# Patient Record
Sex: Female | Born: 1955 | Race: White | Hispanic: No | Marital: Married | Smoking: Never smoker
Health system: Southern US, Community
[De-identification: ages and names within clinical notes are randomized; demographics above are authoritative.]

---

## 2005-05-15 ENCOUNTER — Emergency Department (HOSPITAL_COMMUNITY): Admission: EM | Admit: 2005-05-15 | Discharge: 2005-05-15 | Payer: Self-pay | Admitting: Emergency Medicine

## 2005-09-13 ENCOUNTER — Encounter (INDEPENDENT_AMBULATORY_CARE_PROVIDER_SITE_OTHER): Payer: Self-pay | Admitting: Diagnostic Radiology

## 2005-09-13 ENCOUNTER — Encounter (INDEPENDENT_AMBULATORY_CARE_PROVIDER_SITE_OTHER): Payer: Self-pay | Admitting: Specialist

## 2005-09-13 ENCOUNTER — Encounter: Admission: RE | Admit: 2005-09-13 | Discharge: 2005-09-13 | Payer: Self-pay | Admitting: Surgery

## 2005-09-21 ENCOUNTER — Encounter: Admission: RE | Admit: 2005-09-21 | Discharge: 2005-09-21 | Payer: Self-pay | Admitting: Surgery

## 2005-09-26 ENCOUNTER — Other Ambulatory Visit: Admission: RE | Admit: 2005-09-26 | Discharge: 2005-09-26 | Payer: Self-pay | Admitting: *Deleted

## 2005-09-29 ENCOUNTER — Ambulatory Visit: Payer: Self-pay | Admitting: Oncology

## 2005-10-07 ENCOUNTER — Ambulatory Visit (HOSPITAL_COMMUNITY): Admission: RE | Admit: 2005-10-07 | Discharge: 2005-10-07 | Payer: Self-pay | Admitting: Oncology

## 2005-10-11 ENCOUNTER — Encounter: Admission: RE | Admit: 2005-10-11 | Discharge: 2005-10-11 | Payer: Self-pay | Admitting: Oncology

## 2005-10-14 ENCOUNTER — Ambulatory Visit: Payer: Self-pay

## 2005-10-14 ENCOUNTER — Ambulatory Visit (HOSPITAL_COMMUNITY): Admission: RE | Admit: 2005-10-14 | Discharge: 2005-10-14 | Payer: Self-pay | Admitting: Gynecology

## 2005-10-14 ENCOUNTER — Encounter: Payer: Self-pay | Admitting: Internal Medicine

## 2005-10-21 ENCOUNTER — Ambulatory Visit (HOSPITAL_BASED_OUTPATIENT_CLINIC_OR_DEPARTMENT_OTHER): Admission: RE | Admit: 2005-10-21 | Discharge: 2005-10-21 | Payer: Self-pay | Admitting: Surgery

## 2005-11-03 LAB — CBC WITH DIFFERENTIAL/PLATELET
BASO%: 0.5 % (ref 0.0–2.0)
Eosinophils Absolute: 0 10*3/uL (ref 0.0–0.5)
MCHC: 34.1 g/dL (ref 32.0–36.0)
MONO#: 0.2 10*3/uL (ref 0.1–0.9)
NEUT#: 1.6 10*3/uL (ref 1.5–6.5)
RBC: 4.08 10*6/uL (ref 3.70–5.32)
RDW: 13.9 % (ref 11.3–14.5)
WBC: 3.1 10*3/uL — ABNORMAL LOW (ref 3.9–10.0)

## 2005-11-09 LAB — CBC WITH DIFFERENTIAL/PLATELET
BASO%: 0.3 % (ref 0.0–2.0)
Eosinophils Absolute: 0 10*3/uL (ref 0.0–0.5)
HCT: 39.4 % (ref 34.8–46.6)
LYMPH%: 20.8 % (ref 14.0–48.0)
MCHC: 33.5 g/dL (ref 32.0–36.0)
MONO#: 0.8 10*3/uL (ref 0.1–0.9)
NEUT#: 5.9 10*3/uL (ref 1.5–6.5)
NEUT%: 69.1 % (ref 39.6–76.8)
Platelets: 210 10*3/uL (ref 145–400)
RBC: 4.41 10*6/uL (ref 3.70–5.32)
WBC: 8.6 10*3/uL (ref 3.9–10.0)
lymph#: 1.8 10*3/uL (ref 0.9–3.3)

## 2005-11-15 ENCOUNTER — Ambulatory Visit: Payer: Self-pay | Admitting: Oncology

## 2005-11-16 LAB — CBC WITH DIFFERENTIAL/PLATELET
Basophils Absolute: 0 10*3/uL (ref 0.0–0.1)
HCT: 36.5 % (ref 34.8–46.6)
HGB: 12.4 g/dL (ref 11.6–15.9)
LYMPH%: 20.7 % (ref 14.0–48.0)
MCHC: 33.9 g/dL (ref 32.0–36.0)
MONO#: 0.1 10*3/uL (ref 0.1–0.9)
NEUT%: 74.8 % (ref 39.6–76.8)
Platelets: 210 10*3/uL (ref 145–400)
WBC: 3.1 10*3/uL — ABNORMAL LOW (ref 3.9–10.0)
lymph#: 0.6 10*3/uL — ABNORMAL LOW (ref 0.9–3.3)

## 2005-11-23 LAB — CBC WITH DIFFERENTIAL/PLATELET
BASO%: 0.3 % (ref 0.0–2.0)
Basophils Absolute: 0 10*3/uL (ref 0.0–0.1)
EOS%: 0.1 % (ref 0.0–7.0)
HCT: 35.1 % (ref 34.8–46.6)
HGB: 11.9 g/dL (ref 11.6–15.9)
LYMPH%: 15.9 % (ref 14.0–48.0)
MCH: 30.3 pg (ref 26.0–34.0)
MCHC: 34 g/dL (ref 32.0–36.0)
MCV: 89.3 fL (ref 81.0–101.0)
MONO%: 8.6 % (ref 0.0–13.0)
NEUT%: 75.1 % (ref 39.6–76.8)
Platelets: 147 10*3/uL (ref 145–400)

## 2005-11-30 LAB — CBC WITH DIFFERENTIAL/PLATELET
Basophils Absolute: 0 10*3/uL (ref 0.0–0.1)
Eosinophils Absolute: 0 10*3/uL (ref 0.0–0.5)
HCT: 34.1 % — ABNORMAL LOW (ref 34.8–46.6)
HGB: 11.7 g/dL (ref 11.6–15.9)
MONO#: 0.1 10*3/uL (ref 0.1–0.9)
NEUT#: 2.4 10*3/uL (ref 1.5–6.5)
NEUT%: 80.7 % — ABNORMAL HIGH (ref 39.6–76.8)
WBC: 3 10*3/uL — ABNORMAL LOW (ref 3.9–10.0)
lymph#: 0.5 10*3/uL — ABNORMAL LOW (ref 0.9–3.3)

## 2005-12-08 LAB — CBC WITH DIFFERENTIAL/PLATELET
Basophils Absolute: 0.1 10*3/uL (ref 0.0–0.1)
EOS%: 0.2 % (ref 0.0–7.0)
Eosinophils Absolute: 0 10*3/uL (ref 0.0–0.5)
HCT: 36.2 % (ref 34.8–46.6)
HGB: 12.7 g/dL (ref 11.6–15.9)
MCH: 31 pg (ref 26.0–34.0)
MCV: 88.7 fL (ref 81.0–101.0)
MONO%: 15.7 % — ABNORMAL HIGH (ref 0.0–13.0)
NEUT%: 61.8 % (ref 39.6–76.8)

## 2005-12-15 ENCOUNTER — Encounter: Admission: RE | Admit: 2005-12-15 | Discharge: 2005-12-15 | Payer: Self-pay | Admitting: Oncology

## 2005-12-19 LAB — CBC WITH DIFFERENTIAL/PLATELET
Basophils Absolute: 0.2 10*3/uL — ABNORMAL HIGH (ref 0.0–0.1)
EOS%: 0.1 % (ref 0.0–7.0)
HGB: 11.7 g/dL (ref 11.6–15.9)
LYMPH%: 11.7 % — ABNORMAL LOW (ref 14.0–48.0)
MCH: 30.8 pg (ref 26.0–34.0)
MCV: 89.5 fL (ref 81.0–101.0)
MONO%: 8.2 % (ref 0.0–13.0)
Platelets: 151 10*3/uL (ref 145–400)
RBC: 3.82 10*6/uL (ref 3.70–5.32)
RDW: 16.6 % — ABNORMAL HIGH (ref 11.3–14.5)

## 2005-12-22 LAB — CBC WITH DIFFERENTIAL/PLATELET
BASO%: 1.2 % (ref 0.0–2.0)
EOS%: 0.1 % (ref 0.0–7.0)
Eosinophils Absolute: 0 10*3/uL (ref 0.0–0.5)
LYMPH%: 12.1 % — ABNORMAL LOW (ref 14.0–48.0)
MCHC: 35.2 g/dL (ref 32.0–36.0)
MCV: 88.8 fL (ref 81.0–101.0)
MONO%: 9.1 % (ref 0.0–13.0)
NEUT#: 6.9 10*3/uL — ABNORMAL HIGH (ref 1.5–6.5)
Platelets: 193 10*3/uL (ref 145–400)
RBC: 3.99 10*6/uL (ref 3.70–5.32)
RDW: 16.5 % — ABNORMAL HIGH (ref 11.3–14.5)

## 2005-12-28 ENCOUNTER — Ambulatory Visit: Payer: Self-pay | Admitting: Oncology

## 2005-12-28 LAB — CBC WITH DIFFERENTIAL/PLATELET
BASO%: 1.4 % (ref 0.0–2.0)
Eosinophils Absolute: 0 10*3/uL (ref 0.0–0.5)
LYMPH%: 36.9 % (ref 14.0–48.0)
MCHC: 34.3 g/dL (ref 32.0–36.0)
MONO#: 0.4 10*3/uL (ref 0.1–0.9)
NEUT#: 0.7 10*3/uL — ABNORMAL LOW (ref 1.5–6.5)
RBC: 3.75 10*6/uL (ref 3.70–5.32)
RDW: 18.5 % — ABNORMAL HIGH (ref 11.3–14.5)
WBC: 1.9 10*3/uL — ABNORMAL LOW (ref 3.9–10.0)
lymph#: 0.7 10*3/uL — ABNORMAL LOW (ref 0.9–3.3)

## 2006-01-06 LAB — CBC WITH DIFFERENTIAL/PLATELET
BASO%: 2.6 % — ABNORMAL HIGH (ref 0.0–2.0)
LYMPH%: 25.2 % (ref 14.0–48.0)
MCHC: 33.9 g/dL (ref 32.0–36.0)
MONO#: 0.9 10*3/uL (ref 0.1–0.9)
MONO%: 30 % — ABNORMAL HIGH (ref 0.0–13.0)
NEUT#: 1.3 10*3/uL — ABNORMAL LOW (ref 1.5–6.5)
Platelets: 209 10*3/uL (ref 145–400)
RBC: 4.04 10*6/uL (ref 3.70–5.32)
RDW: 15.3 % — ABNORMAL HIGH (ref 11.3–14.5)
WBC: 3 10*3/uL — ABNORMAL LOW (ref 3.9–10.0)

## 2006-01-13 LAB — CBC WITH DIFFERENTIAL/PLATELET
BASO%: 1.8 % (ref 0.0–2.0)
Eosinophils Absolute: 0.1 10*3/uL (ref 0.0–0.5)
HCT: 39.5 % (ref 34.8–46.6)
MCHC: 33.6 g/dL (ref 32.0–36.0)
MONO#: 0.7 10*3/uL (ref 0.1–0.9)
NEUT#: 2.7 10*3/uL (ref 1.5–6.5)
RBC: 4.31 10*6/uL (ref 3.70–5.32)
WBC: 4.5 10*3/uL (ref 3.9–10.0)
lymph#: 1 10*3/uL (ref 0.9–3.3)

## 2006-01-20 LAB — CBC WITH DIFFERENTIAL/PLATELET
Basophils Absolute: 0.1 10*3/uL (ref 0.0–0.1)
Eosinophils Absolute: 0.1 10*3/uL (ref 0.0–0.5)
HCT: 35.8 % (ref 34.8–46.6)
HGB: 12.1 g/dL (ref 11.6–15.9)
MONO#: 0.5 10*3/uL (ref 0.1–0.9)
NEUT%: 47 % (ref 39.6–76.8)
WBC: 2.7 10*3/uL — ABNORMAL LOW (ref 3.9–10.0)
lymph#: 0.8 10*3/uL — ABNORMAL LOW (ref 0.9–3.3)

## 2006-01-27 LAB — CBC WITH DIFFERENTIAL/PLATELET
BASO%: 3 % — ABNORMAL HIGH (ref 0.0–2.0)
HCT: 35 % (ref 34.8–46.6)
MCHC: 34.3 g/dL (ref 32.0–36.0)
MONO#: 0.4 10*3/uL (ref 0.1–0.9)
NEUT%: 43 % (ref 39.6–76.8)
RBC: 3.82 10*6/uL (ref 3.70–5.32)
WBC: 2.4 10*3/uL — ABNORMAL LOW (ref 3.9–10.0)
lymph#: 0.8 10*3/uL — ABNORMAL LOW (ref 0.9–3.3)

## 2006-02-03 LAB — CBC WITH DIFFERENTIAL/PLATELET
Eosinophils Absolute: 0 10*3/uL (ref 0.0–0.5)
HGB: 12.7 g/dL (ref 11.6–15.9)
LYMPH%: 32.3 % (ref 14.0–48.0)
MONO#: 0.4 10*3/uL (ref 0.1–0.9)
NEUT#: 1.3 10*3/uL — ABNORMAL LOW (ref 1.5–6.5)
Platelets: 249 10*3/uL (ref 145–400)
RBC: 4.03 10*6/uL (ref 3.70–5.32)
WBC: 2.7 10*3/uL — ABNORMAL LOW (ref 3.9–10.0)

## 2006-02-10 ENCOUNTER — Ambulatory Visit: Payer: Self-pay | Admitting: Oncology

## 2006-02-10 LAB — CBC WITH DIFFERENTIAL/PLATELET
Eosinophils Absolute: 0 10*3/uL (ref 0.0–0.5)
HCT: 39.8 % (ref 34.8–46.6)
LYMPH%: 38.9 % (ref 14.0–48.0)
MCHC: 34 g/dL (ref 32.0–36.0)
MONO#: 0.9 10*3/uL (ref 0.1–0.9)
NEUT#: 1 10*3/uL — ABNORMAL LOW (ref 1.5–6.5)
NEUT%: 31.8 % — ABNORMAL LOW (ref 39.6–76.8)
Platelets: 228 10*3/uL (ref 145–400)
WBC: 3.2 10*3/uL — ABNORMAL LOW (ref 3.9–10.0)

## 2006-02-17 LAB — CBC WITH DIFFERENTIAL/PLATELET
Basophils Absolute: 0.1 10*3/uL (ref 0.0–0.1)
EOS%: 1.2 % (ref 0.0–7.0)
Eosinophils Absolute: 0 10*3/uL (ref 0.0–0.5)
HGB: 12.7 g/dL (ref 11.6–15.9)
LYMPH%: 42.4 % (ref 14.0–48.0)
MCH: 32 pg (ref 26.0–34.0)
MCV: 93.6 fL (ref 81.0–101.0)
MONO%: 14.2 % — ABNORMAL HIGH (ref 0.0–13.0)
NEUT#: 0.9 10*3/uL — ABNORMAL LOW (ref 1.5–6.5)
NEUT%: 39.6 % (ref 39.6–76.8)
Platelets: 179 10*3/uL (ref 145–400)
RDW: 12.6 % (ref 11.3–14.5)

## 2006-02-24 LAB — CBC WITH DIFFERENTIAL/PLATELET
EOS%: 1.6 % (ref 0.0–7.0)
Eosinophils Absolute: 0 10*3/uL (ref 0.0–0.5)
LYMPH%: 38.2 % (ref 14.0–48.0)
MCH: 31.8 pg (ref 26.0–34.0)
MCHC: 34 g/dL (ref 32.0–36.0)
MCV: 93.4 fL (ref 81.0–101.0)
MONO%: 15.1 % — ABNORMAL HIGH (ref 0.0–13.0)
NEUT#: 1 10*3/uL — ABNORMAL LOW (ref 1.5–6.5)
Platelets: 205 10*3/uL (ref 145–400)
RBC: 3.81 10*6/uL (ref 3.70–5.32)
RDW: 12.9 % (ref 11.3–14.5)

## 2006-03-03 LAB — CBC WITH DIFFERENTIAL/PLATELET
Basophils Absolute: 0.1 10*3/uL (ref 0.0–0.1)
Eosinophils Absolute: 0 10*3/uL (ref 0.0–0.5)
HGB: 12.7 g/dL (ref 11.6–15.9)
MCV: 93 fL (ref 81.0–101.0)
MONO%: 10.3 % (ref 0.0–13.0)
NEUT#: 1.1 10*3/uL — ABNORMAL LOW (ref 1.5–6.5)
Platelets: 216 10*3/uL (ref 145–400)
RDW: 12.7 % (ref 11.3–14.5)

## 2006-03-20 ENCOUNTER — Encounter: Admission: RE | Admit: 2006-03-20 | Discharge: 2006-03-20 | Payer: Self-pay | Admitting: Oncology

## 2006-03-24 LAB — CBC WITH DIFFERENTIAL/PLATELET
Basophils Absolute: 0.1 10*3/uL (ref 0.0–0.1)
Eosinophils Absolute: 0.1 10*3/uL (ref 0.0–0.5)
HCT: 41.8 % (ref 34.8–46.6)
LYMPH%: 30.8 % (ref 14.0–48.0)
MCV: 91.3 fL (ref 81.0–101.0)
MONO%: 18.5 % — ABNORMAL HIGH (ref 0.0–13.0)
NEUT#: 1.6 10*3/uL (ref 1.5–6.5)
NEUT%: 46.8 % (ref 39.6–76.8)
Platelets: 183 10*3/uL (ref 145–400)
RBC: 4.57 10*6/uL (ref 3.70–5.32)

## 2006-04-10 ENCOUNTER — Encounter: Admission: RE | Admit: 2006-04-10 | Discharge: 2006-04-10 | Payer: Self-pay | Admitting: Surgery

## 2006-04-11 ENCOUNTER — Encounter: Admission: RE | Admit: 2006-04-11 | Discharge: 2006-04-11 | Payer: Self-pay | Admitting: Surgery

## 2006-04-11 ENCOUNTER — Encounter (INDEPENDENT_AMBULATORY_CARE_PROVIDER_SITE_OTHER): Payer: Self-pay | Admitting: Surgery

## 2006-04-11 ENCOUNTER — Encounter (INDEPENDENT_AMBULATORY_CARE_PROVIDER_SITE_OTHER): Payer: Self-pay | Admitting: Specialist

## 2006-04-11 ENCOUNTER — Ambulatory Visit (HOSPITAL_BASED_OUTPATIENT_CLINIC_OR_DEPARTMENT_OTHER): Admission: RE | Admit: 2006-04-11 | Discharge: 2006-04-11 | Payer: Self-pay | Admitting: Surgery

## 2006-04-21 ENCOUNTER — Ambulatory Visit: Payer: Self-pay | Admitting: Oncology

## 2006-04-25 LAB — CBC WITH DIFFERENTIAL/PLATELET
BASO%: 0.4 % (ref 0.0–2.0)
EOS%: 1 % (ref 0.0–7.0)
LYMPH%: 37.5 % (ref 14.0–48.0)
MCH: 30.8 pg (ref 26.0–34.0)
MCHC: 34.3 g/dL (ref 32.0–36.0)
MCV: 90 fL (ref 81.0–101.0)
MONO%: 11.6 % (ref 0.0–13.0)
NEUT%: 49.5 % (ref 39.6–76.8)
Platelets: 215 10*3/uL (ref 145–400)
RBC: 4.38 10*6/uL (ref 3.70–5.32)
WBC: 3.5 10*3/uL — ABNORMAL LOW (ref 3.9–10.0)

## 2006-05-01 ENCOUNTER — Ambulatory Visit: Admission: RE | Admit: 2006-05-01 | Discharge: 2006-07-09 | Payer: Self-pay | Admitting: *Deleted

## 2006-05-03 LAB — COMPREHENSIVE METABOLIC PANEL
ALT: 36 U/L (ref 0–40)
AST: 44 U/L — ABNORMAL HIGH (ref 0–37)
Alkaline Phosphatase: 48 U/L (ref 39–117)
CO2: 27 mEq/L (ref 19–32)
Creatinine, Ser: 0.77 mg/dL (ref 0.40–1.20)
Total Bilirubin: 0.5 mg/dL (ref 0.3–1.2)

## 2006-05-03 LAB — CANCER ANTIGEN 27.29: CA 27.29: 16 U/mL (ref 0–39)

## 2006-06-14 ENCOUNTER — Ambulatory Visit (HOSPITAL_BASED_OUTPATIENT_CLINIC_OR_DEPARTMENT_OTHER): Admission: RE | Admit: 2006-06-14 | Discharge: 2006-06-14 | Payer: Self-pay | Admitting: Surgery

## 2006-07-20 ENCOUNTER — Ambulatory Visit: Payer: Self-pay | Admitting: Oncology

## 2006-07-27 LAB — CBC WITH DIFFERENTIAL/PLATELET
Basophils Absolute: 0 10*3/uL (ref 0.0–0.1)
EOS%: 0.6 % (ref 0.0–7.0)
HCT: 42.8 % (ref 34.8–46.6)
HGB: 14.7 g/dL (ref 11.6–15.9)
MCH: 30.8 pg (ref 26.0–34.0)
MCV: 89.9 fL (ref 81.0–101.0)
MONO%: 10.2 % (ref 0.0–13.0)
NEUT%: 65.6 % (ref 39.6–76.8)

## 2006-07-27 LAB — COMPREHENSIVE METABOLIC PANEL
AST: 55 U/L — ABNORMAL HIGH (ref 0–37)
Albumin: 4.7 g/dL (ref 3.5–5.2)
Alkaline Phosphatase: 70 U/L (ref 39–117)
BUN: 8 mg/dL (ref 6–23)
CO2: 27 mEq/L (ref 19–32)
Creatinine, Ser: 0.77 mg/dL (ref 0.40–1.20)

## 2006-10-10 ENCOUNTER — Ambulatory Visit: Payer: Self-pay | Admitting: Oncology

## 2006-10-20 LAB — CBC WITH DIFFERENTIAL/PLATELET
BASO%: 0.5 % (ref 0.0–2.0)
Basophils Absolute: 0 10*3/uL (ref 0.0–0.1)
EOS%: 0.5 % (ref 0.0–7.0)
HGB: 15.1 g/dL (ref 11.6–15.9)
MCH: 31.8 pg (ref 26.0–34.0)
MCHC: 35.6 g/dL (ref 32.0–36.0)
MCV: 89.3 fL (ref 81.0–101.0)
MONO%: 9.9 % (ref 0.0–13.0)
RBC: 4.76 10*6/uL (ref 3.70–5.32)
RDW: 12.3 % (ref 11.3–14.5)

## 2006-10-23 ENCOUNTER — Ambulatory Visit (HOSPITAL_COMMUNITY): Admission: RE | Admit: 2006-10-23 | Discharge: 2006-10-23 | Payer: Self-pay | Admitting: Oncology

## 2006-10-23 LAB — COMPREHENSIVE METABOLIC PANEL
AST: 48 U/L — ABNORMAL HIGH (ref 0–37)
BUN: 9 mg/dL (ref 6–23)
Calcium: 10 mg/dL (ref 8.4–10.5)
Chloride: 104 mEq/L (ref 96–112)
Creatinine, Ser: 0.76 mg/dL (ref 0.40–1.20)

## 2006-10-23 LAB — HEPATITIS B SURFACE ANTIGEN: Hepatitis B Surface Ag: NEGATIVE

## 2006-10-23 LAB — HEPATITIS C ANTIBODY: HCV Ab: POSITIVE — AB

## 2006-10-31 LAB — HEPATITIS C RNA QUANTITATIVE: HCV Quantitative: 5640000 IU/mL — ABNORMAL HIGH (ref <5)

## 2007-01-16 ENCOUNTER — Ambulatory Visit: Payer: Self-pay | Admitting: Oncology

## 2007-02-13 LAB — COMPREHENSIVE METABOLIC PANEL
Alkaline Phosphatase: 72 U/L (ref 39–117)
Glucose, Bld: 109 mg/dL — ABNORMAL HIGH (ref 70–99)
Sodium: 140 mEq/L (ref 135–145)
Total Bilirubin: 0.6 mg/dL (ref 0.3–1.2)
Total Protein: 7.3 g/dL (ref 6.0–8.3)

## 2007-02-13 LAB — CBC WITH DIFFERENTIAL/PLATELET
BASO%: 0.6 % (ref 0.0–2.0)
EOS%: 0.8 % (ref 0.0–7.0)
Eosinophils Absolute: 0 10*3/uL (ref 0.0–0.5)
LYMPH%: 29.3 % (ref 14.0–48.0)
MCH: 31 pg (ref 26.0–34.0)
MCHC: 35.4 g/dL (ref 32.0–36.0)
MCV: 87.6 fL (ref 81.0–101.0)
MONO%: 10.3 % (ref 0.0–13.0)
Platelets: 180 10*3/uL (ref 145–400)
RBC: 4.32 10*6/uL (ref 3.70–5.32)

## 2007-04-04 ENCOUNTER — Encounter: Admission: RE | Admit: 2007-04-04 | Discharge: 2007-04-04 | Payer: Self-pay | Admitting: Oncology

## 2007-04-17 ENCOUNTER — Ambulatory Visit: Payer: Self-pay | Admitting: Oncology

## 2007-04-19 LAB — COMPREHENSIVE METABOLIC PANEL
ALT: 51 U/L — ABNORMAL HIGH (ref 0–35)
Albumin: 4.5 g/dL (ref 3.5–5.2)
CO2: 26 mEq/L (ref 19–32)
Calcium: 9.2 mg/dL (ref 8.4–10.5)
Chloride: 102 mEq/L (ref 96–112)
Sodium: 139 mEq/L (ref 135–145)
Total Protein: 7.5 g/dL (ref 6.0–8.3)

## 2007-04-19 LAB — CBC WITH DIFFERENTIAL/PLATELET
BASO%: 0.3 % (ref 0.0–2.0)
Eosinophils Absolute: 0 10*3/uL (ref 0.0–0.5)
HCT: 39 % (ref 34.8–46.6)
LYMPH%: 34.3 % (ref 14.0–48.0)
MCHC: 34.8 g/dL (ref 32.0–36.0)
MONO#: 0.5 10*3/uL (ref 0.1–0.9)
NEUT%: 54.5 % (ref 39.6–76.8)
Platelets: 198 10*3/uL (ref 145–400)
WBC: 4.8 10*3/uL (ref 3.9–10.0)

## 2007-04-19 LAB — CANCER ANTIGEN 27.29: CA 27.29: 18 U/mL (ref 0–39)

## 2007-04-19 LAB — TSH: TSH: 1.229 u[IU]/mL (ref 0.350–5.500)

## 2007-05-15 ENCOUNTER — Ambulatory Visit: Payer: Self-pay | Admitting: Gastroenterology

## 2007-05-28 IMAGING — CR DG ANKLE COMPLETE 3+V*R*
3 series · 3 of 3 positions shown · non-contrast
Comparison: none

CLINICAL DATA: Trauma to the right ankle with pain and swelling over the lateral malleolus.
 RIGHT ANKLE - 3 VIEW:

[t ankle joint ap right]
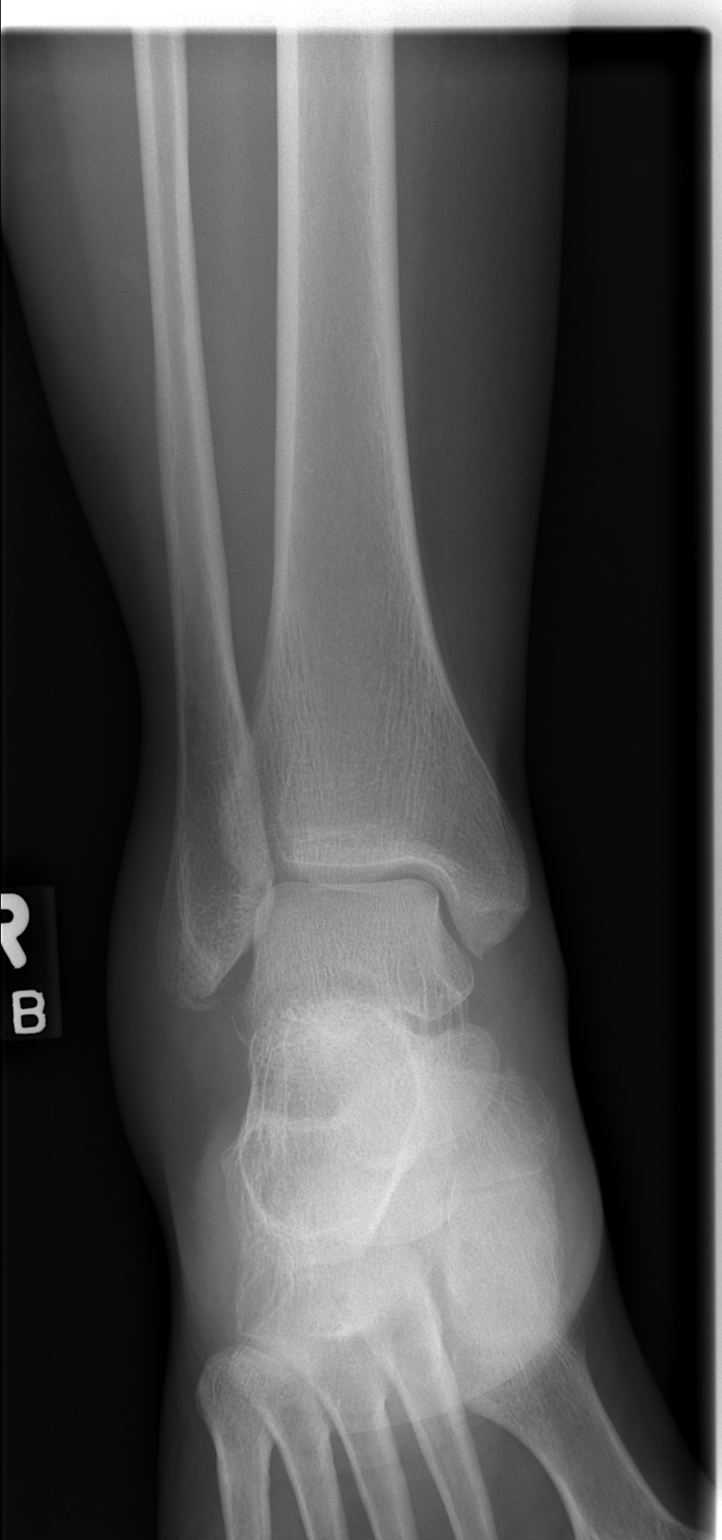

[t ankle joint oblique right]
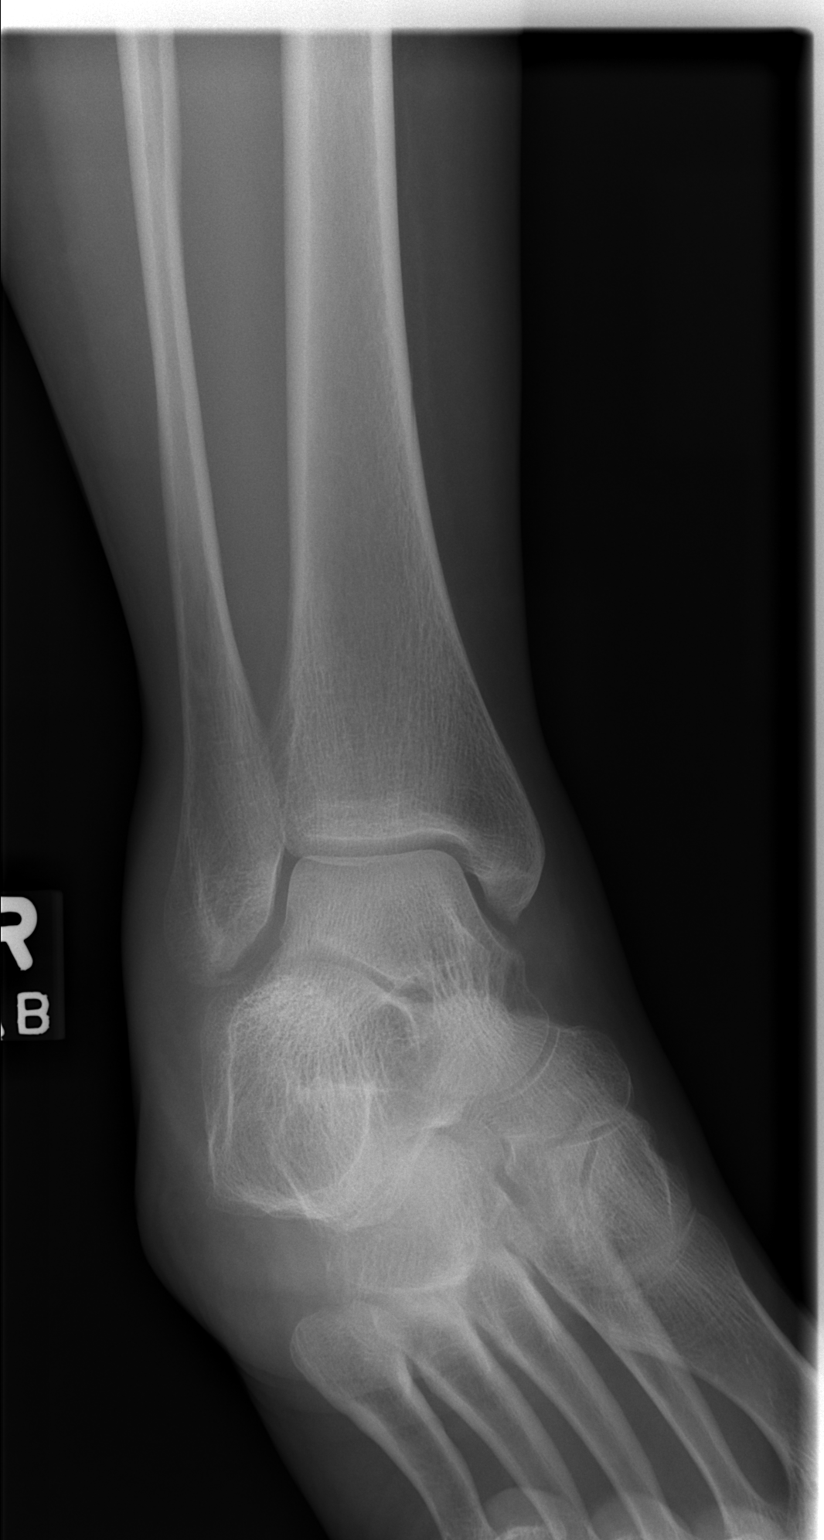

[t ankle joint lat right]
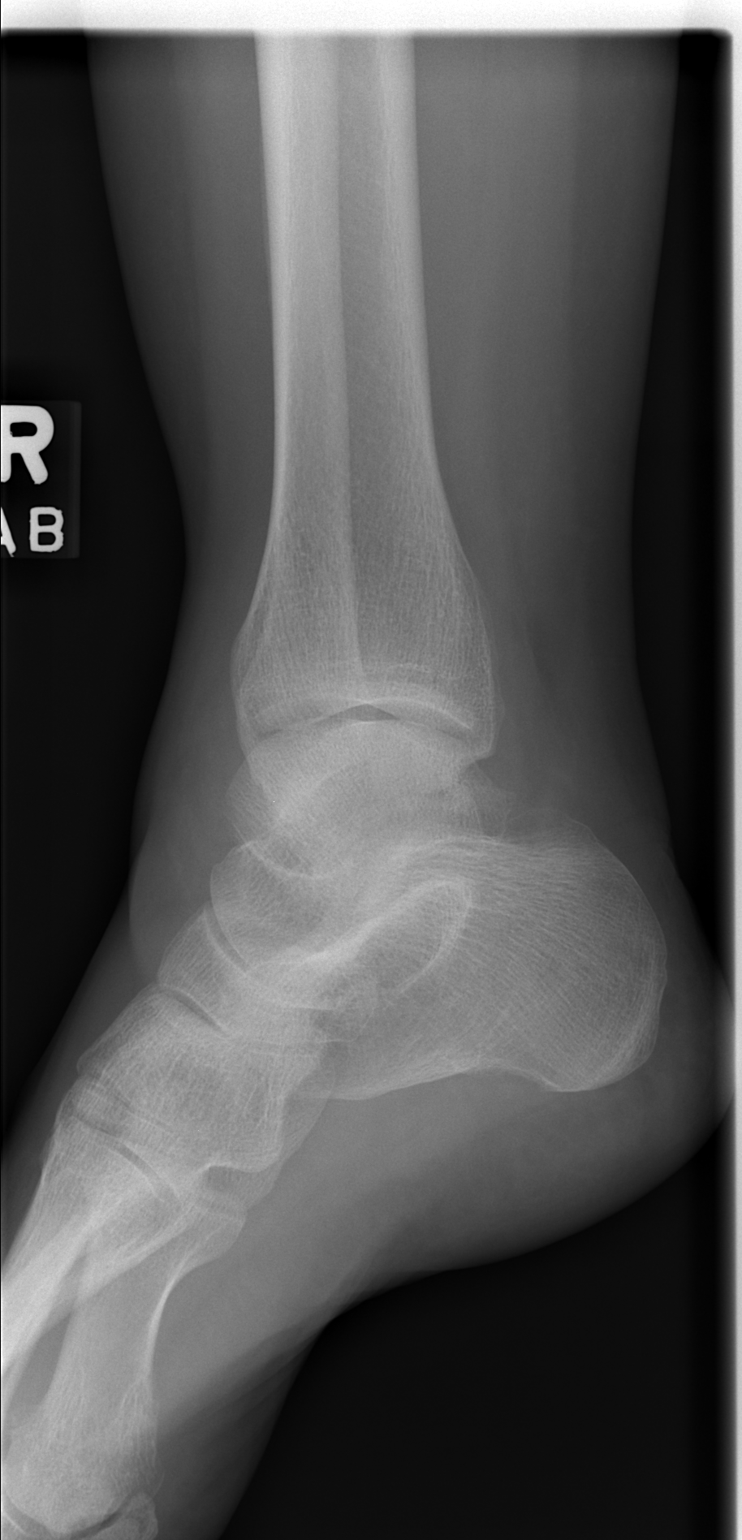

[3 of 3 positions shown; findings below may reference images not displayed]

FINDINGS: There is an avulsion fracture involving the distal right fibula with overlying lateral malleolar swelling.   Ankle mortise is symmetric.   No other fracture or evidence for dislocation is seen.
IMPRESSION: Avulsion fracture of the distal right fibula with overlying soft tissue swelling.

## 2007-06-12 ENCOUNTER — Encounter (INDEPENDENT_AMBULATORY_CARE_PROVIDER_SITE_OTHER): Payer: Self-pay | Admitting: Interventional Radiology

## 2007-06-12 ENCOUNTER — Ambulatory Visit (HOSPITAL_COMMUNITY): Admission: RE | Admit: 2007-06-12 | Discharge: 2007-06-12 | Payer: Self-pay | Admitting: Gastroenterology

## 2007-07-10 ENCOUNTER — Ambulatory Visit: Payer: Self-pay | Admitting: Oncology

## 2007-07-12 LAB — COMPREHENSIVE METABOLIC PANEL
ALT: 46 U/L — ABNORMAL HIGH (ref 0–35)
AST: 44 U/L — ABNORMAL HIGH (ref 0–37)
Calcium: 9.4 mg/dL (ref 8.4–10.5)
Chloride: 103 mEq/L (ref 96–112)
Creatinine, Ser: 0.95 mg/dL (ref 0.40–1.20)

## 2007-07-12 LAB — CBC WITH DIFFERENTIAL/PLATELET
BASO%: 0.6 % (ref 0.0–2.0)
EOS%: 1.8 % (ref 0.0–7.0)
MCH: 30.4 pg (ref 26.0–34.0)
MCHC: 34.7 g/dL (ref 32.0–36.0)
NEUT%: 38 % — ABNORMAL LOW (ref 39.6–76.8)
RDW: 12.9 % (ref 11.3–14.5)
lymph#: 1.6 10*3/uL (ref 0.9–3.3)

## 2007-10-15 ENCOUNTER — Ambulatory Visit: Payer: Self-pay | Admitting: Oncology

## 2007-10-20 IMAGING — CT CT ABDOMEN W/ CM
2 of 5 series · 15 of 46 positions shown, 17 images · IV contrast (omnipaque)
Comparison: None.

CLINICAL DATA: Newly diagnosed breast cancer.  Now for staging beyond the axilla.
 FDG PET-CT TUMOR IMAGING (SKULL BASE TO THIGHS):
 Fasting Blood Glucose:  103.
TECHNIQUE: 14.75 mCi F-18 FDG were administered via right antecubital fossa.  Full ring PET imaging was performed from the skull base through the mid-thighs 45 minutes after injection.  CT data was obtained and used for attenuation correction and anatomic localization only.  (This was not acquired as a diagnostic CT examination).
 There is a mass within the right breast, which shows significant FDG accumulation.  The mass measures 3.0 x 2.8 cm.  Maximum SUV is 10.2.  No other areas of abnormal FDG accumulation within the neck, chest, abdomen, or pelvis.  No abnormal activity within the right axilla.
TECHNIQUE: Multidetector CT imaging of the chest was performed following the standard protocol during bolus administration of intravenous contrast.
 Contrast:  125 cc Omnipaque 300.
TECHNIQUE: Multidetector CT imaging of the abdomen was performed following the standard protocol during bolus administration of intravenous contrast.
TECHNIQUE: Multidetector CT imaging of the pelvis was performed following the standard protocol during bolus administration of intravenous contrast.

[Series 2: cap 5.0 b40f st · axial · 0.65mm/px · z∈[-626,-80]mm · 12 of 125 slices shown, 14 images]
[im 8/125  soft-tissue]
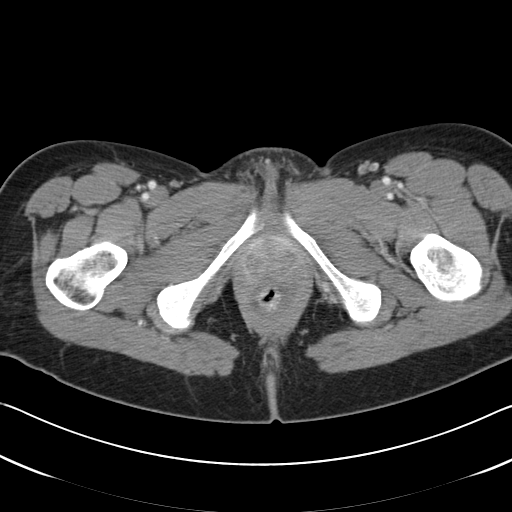
[im 8/125  bone]
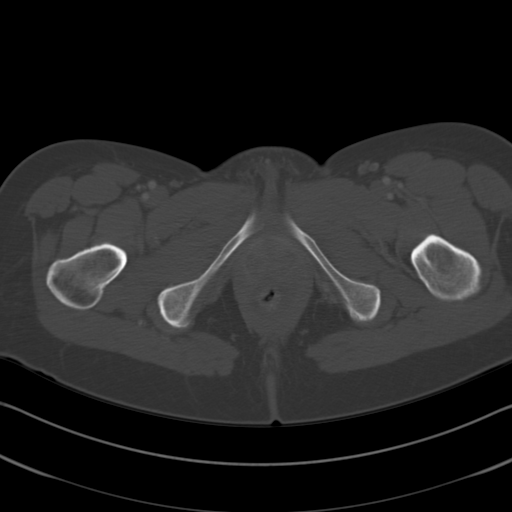
[im 22/125  soft-tissue]
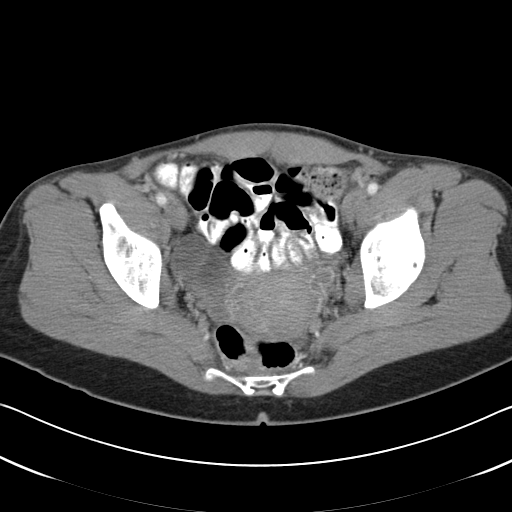
[im 30/125  soft-tissue]
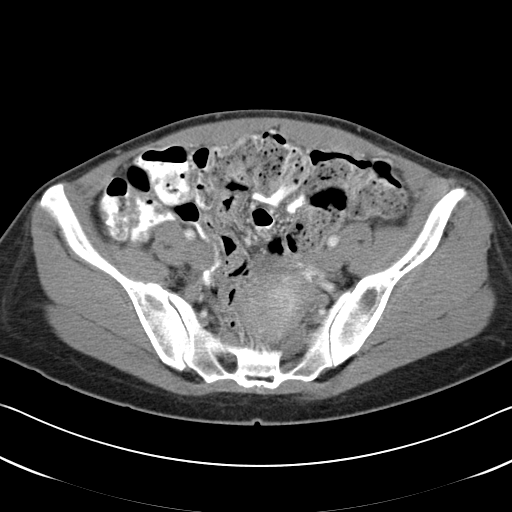
[im 37/125  soft-tissue]
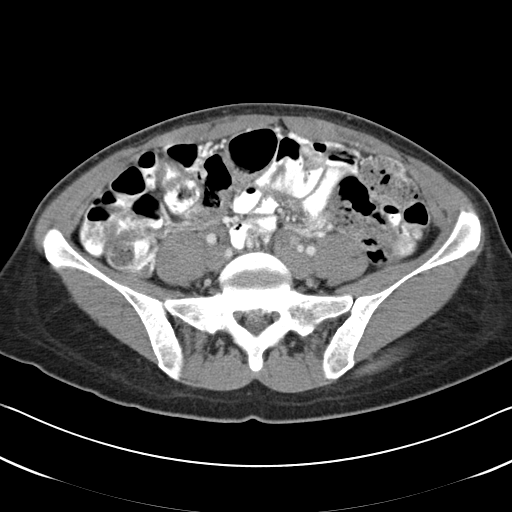
[im 52/125  soft-tissue]
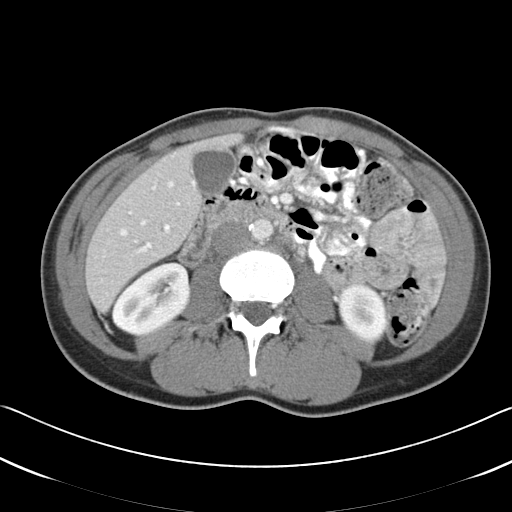
[im 59/125  soft-tissue]
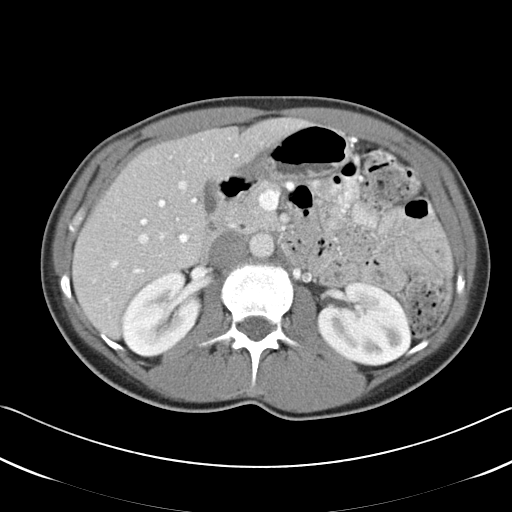
[im 66/125  soft-tissue]
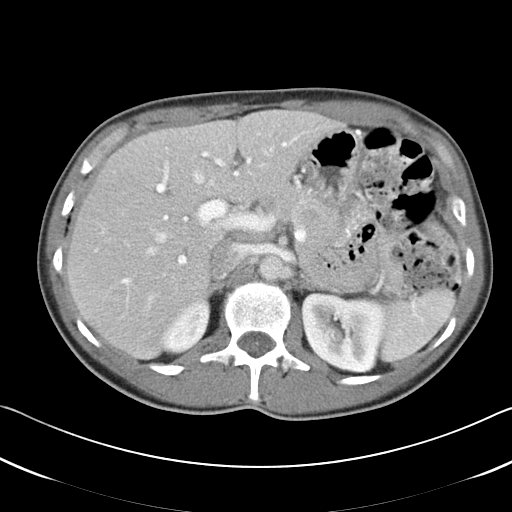
[im 81/125  soft-tissue]
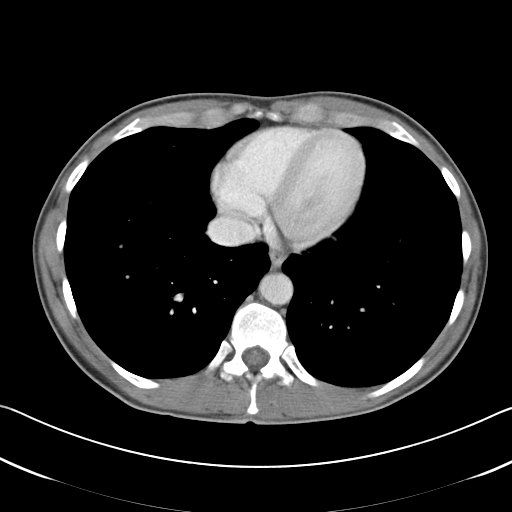
[im 88/125  soft-tissue]
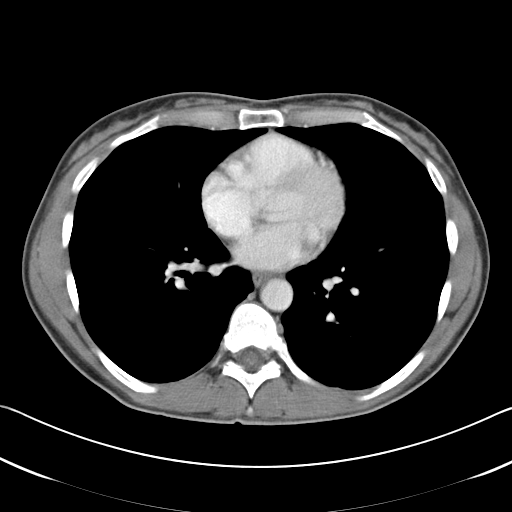
[im 88/125  bone]
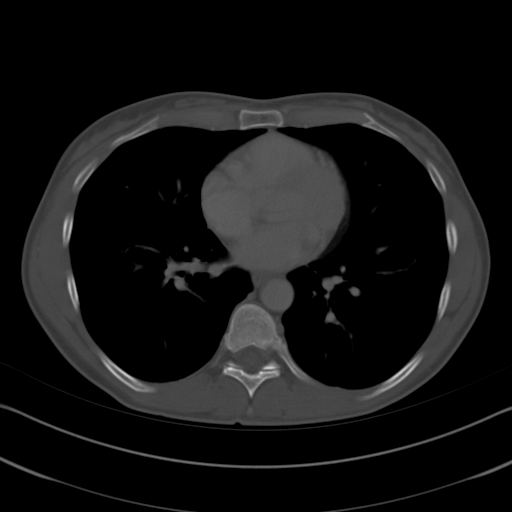
[im 95/125  soft-tissue]
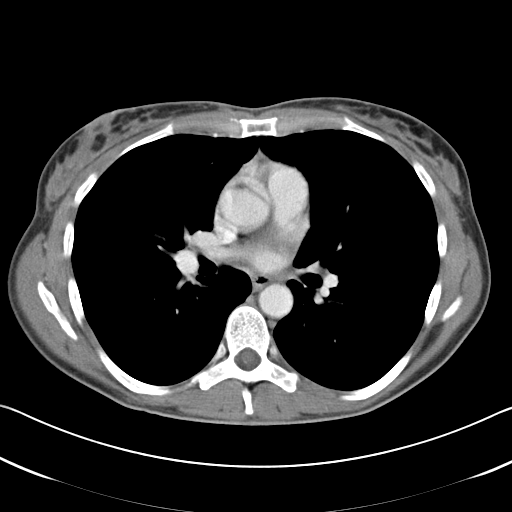
[im 110/125  soft-tissue]
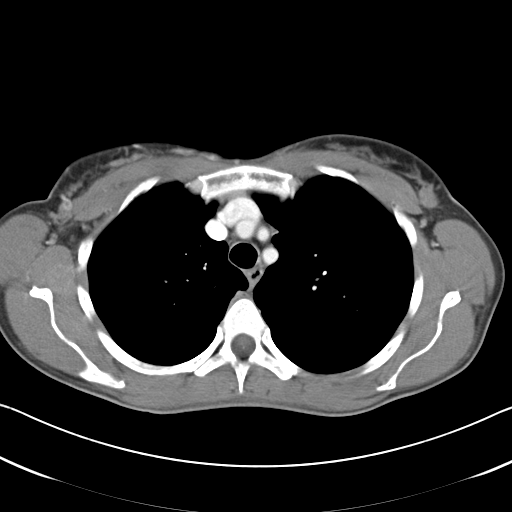
[im 117/125  soft-tissue]
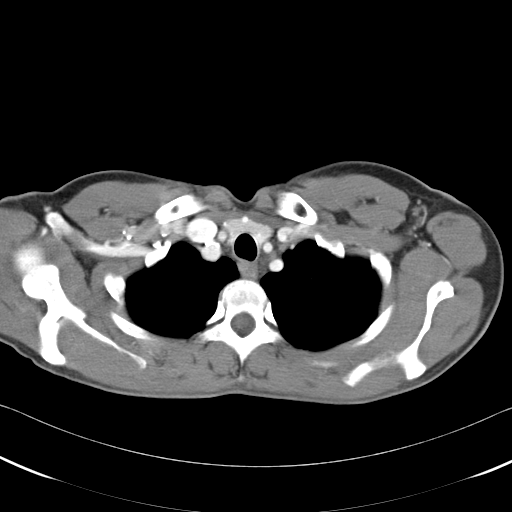

[Series 602: coronal mprs · coronal · 1.25mm/px · 3 of 39 slices shown]
[im 13/39  soft-tissue]
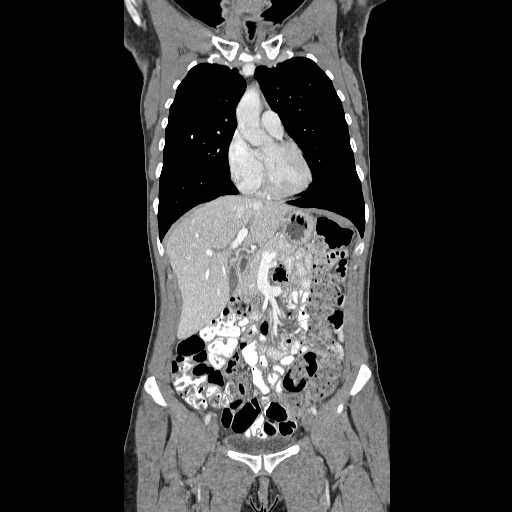
[im 17/39  soft-tissue]
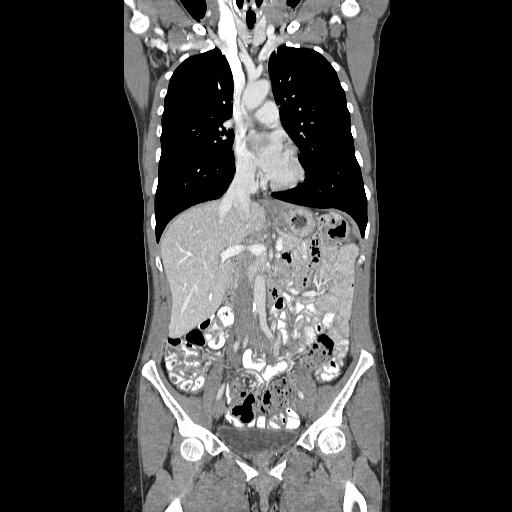
[im 22/39  soft-tissue]
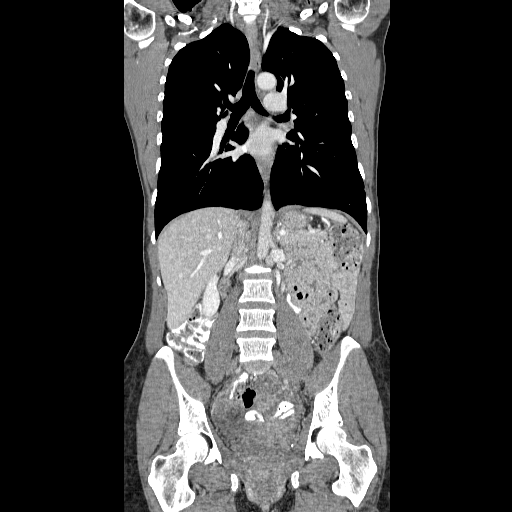

[15 of 46 positions shown; findings below may reference images not displayed]

IMPRESSION: Avid FDG accumulation within the 3.0-cm right breast mass, compatible with the patient's history of newly diagnosed breast cancer.  No other areas of abnormal FDG accumulation. 
 CHEST CT WITH CONTRAST:
FINDINGS: 3.0 x 2.8 cm mass noted within the right breast, which shows enhancement.  No mediastinal, hilar, or axillary adenopathy.  Lungs are clear without suspicious nodule or mass.  No effusions or focal airspace opacity.  Heart is normal size.  Visualized skeleton is unremarkable.
IMPRESSION: 3.0-cm right breast mass, corresponding to area of abnormal activity on today?s PET scan, compatible with the patient's history of newly diagnosed right breast cancer. No evidence of metastatic disease. 
 ABDOMEN CT WITH CONTRAST:
FINDINGS: There are four lesions noted within the liver.  The largest of these is within the left dome, measuring 11 mm on image 52.  Other lesions are noted in the left hepatic lobe near the dome on image 51, the posterior right hepatic lobe on image 58, and near the tip of the liver on image 80.  All these areas appear to have peripheral puddling of contrast and fill-in on the delayed images, most compatible with hemangiomas.  Spleen, pancreas, adrenals, and kidneys are unremarkable.  Gallbladder and bowel are grossly unremarkable.  No free fluid, free air, or adenopathy.  Skeletal structures are unremarkable.
IMPRESSION: Small low-density lesions within the liver, as described above, which appear to have peripheral puddling of contrast, most compatible with hemangiomas.  These areas have no corresponding FDG accumulation on today?s PET scan.  These are most compatible with small hemangiomas, but I recommend attention to these areas on follow-up studies. 
 PELVIS CT WITH CONTRAST:
FINDINGS: Small sclerotic focus within the left iliac wing, most likely a bone island.  2.1-cm cystic area within the right ovary.  This is most likely a functional cyst, but it can be followed with pelvic ultrasound.  Uterus and left adnexa are unremarkable.  No free fluid, free air, or adenopathy.
IMPRESSION: 2.1-cm right ovarian/adnexal cyst.  Recommend followup with ultrasound in 1-2 cycles.  Otherwise negative.

## 2008-05-02 ENCOUNTER — Ambulatory Visit: Payer: Self-pay | Admitting: Oncology

## 2008-05-06 LAB — CBC WITH DIFFERENTIAL/PLATELET
BASO%: 0.5 % (ref 0.0–2.0)
EOS%: 0.6 % (ref 0.0–7.0)
HCT: 41.5 % (ref 34.8–46.6)
MCH: 30.8 pg (ref 26.0–34.0)
MCHC: 34.4 g/dL (ref 32.0–36.0)
NEUT%: 37.5 % — ABNORMAL LOW (ref 39.6–76.8)
RBC: 4.63 10*6/uL (ref 3.70–5.32)
lymph#: 1.8 10*3/uL (ref 0.9–3.3)

## 2008-05-07 LAB — COMPREHENSIVE METABOLIC PANEL
ALT: 39 U/L — ABNORMAL HIGH (ref 0–35)
AST: 36 U/L (ref 0–37)
Calcium: 9.6 mg/dL (ref 8.4–10.5)
Chloride: 104 mEq/L (ref 96–112)
Creatinine, Ser: 0.87 mg/dL (ref 0.40–1.20)
Total Bilirubin: 0.5 mg/dL (ref 0.3–1.2)

## 2008-05-13 ENCOUNTER — Encounter: Admission: RE | Admit: 2008-05-13 | Discharge: 2008-05-13 | Payer: Self-pay | Admitting: Oncology

## 2008-10-30 ENCOUNTER — Ambulatory Visit: Payer: Self-pay | Admitting: Oncology

## 2008-11-28 LAB — COMPREHENSIVE METABOLIC PANEL
ALT: 34 U/L (ref 0–35)
Albumin: 4.5 g/dL (ref 3.5–5.2)
CO2: 27 mEq/L (ref 19–32)
Calcium: 9.2 mg/dL (ref 8.4–10.5)
Chloride: 103 mEq/L (ref 96–112)
Potassium: 3.8 mEq/L (ref 3.5–5.3)
Sodium: 138 mEq/L (ref 135–145)
Total Bilirubin: 0.5 mg/dL (ref 0.3–1.2)
Total Protein: 7.3 g/dL (ref 6.0–8.3)

## 2008-11-28 LAB — CBC WITH DIFFERENTIAL/PLATELET
BASO%: 0.6 % (ref 0.0–2.0)
Eosinophils Absolute: 0 10*3/uL (ref 0.0–0.5)
MCHC: 34.3 g/dL (ref 31.5–36.0)
MONO#: 0.5 10*3/uL (ref 0.1–0.9)
NEUT#: 2 10*3/uL (ref 1.5–6.5)
RBC: 4.44 10*6/uL (ref 3.70–5.45)
WBC: 4.8 10*3/uL (ref 3.9–10.3)
lymph#: 2.2 10*3/uL (ref 0.9–3.3)

## 2008-11-28 LAB — CANCER ANTIGEN 27.29: CA 27.29: 21 U/mL (ref 0–39)

## 2008-12-04 ENCOUNTER — Encounter: Payer: Self-pay | Admitting: Cardiology

## 2009-05-05 ENCOUNTER — Ambulatory Visit: Payer: Self-pay | Admitting: Oncology

## 2009-05-14 ENCOUNTER — Encounter: Payer: Self-pay | Admitting: Cardiology

## 2009-05-14 ENCOUNTER — Encounter: Admission: RE | Admit: 2009-05-14 | Discharge: 2009-05-14 | Payer: Self-pay | Admitting: Oncology

## 2009-05-14 LAB — COMPREHENSIVE METABOLIC PANEL
Albumin: 4.4 g/dL (ref 3.5–5.2)
BUN: 7 mg/dL (ref 6–23)
CO2: 27 mEq/L (ref 19–32)
Glucose, Bld: 89 mg/dL (ref 70–99)
Potassium: 3.7 mEq/L (ref 3.5–5.3)
Sodium: 143 mEq/L (ref 135–145)
Total Bilirubin: 0.4 mg/dL (ref 0.3–1.2)
Total Protein: 6.8 g/dL (ref 6.0–8.3)

## 2009-05-14 LAB — CBC WITH DIFFERENTIAL/PLATELET
Basophils Absolute: 0 10*3/uL (ref 0.0–0.1)
Eosinophils Absolute: 0 10*3/uL (ref 0.0–0.5)
HGB: 13 g/dL (ref 11.6–15.9)
LYMPH%: 49.7 % (ref 14.0–49.7)
MCV: 89.9 fL (ref 79.5–101.0)
MONO#: 0.4 10*3/uL (ref 0.1–0.9)
MONO%: 9.9 % (ref 0.0–14.0)
NEUT#: 1.5 10*3/uL (ref 1.5–6.5)
Platelets: 168 10*3/uL (ref 145–400)
RBC: 4.21 10*6/uL (ref 3.70–5.45)
WBC: 3.9 10*3/uL (ref 3.9–10.3)

## 2009-05-14 LAB — CANCER ANTIGEN 27.29: CA 27.29: 24 U/mL (ref 0–39)

## 2009-06-24 IMAGING — US US BIOPSY
1 series · 13 of 18 positions shown · non-contrast
Comparison: none

CLINICAL DATA: Hepatitis C; request is made for random core liver biopsy.
ULTRASOUND-GUIDED RANDOM CORE LIVER BIOPSY:

[Series 1: biopsy · 0.27mm/px · 13 of 18 slices shown]
[im 1/18]
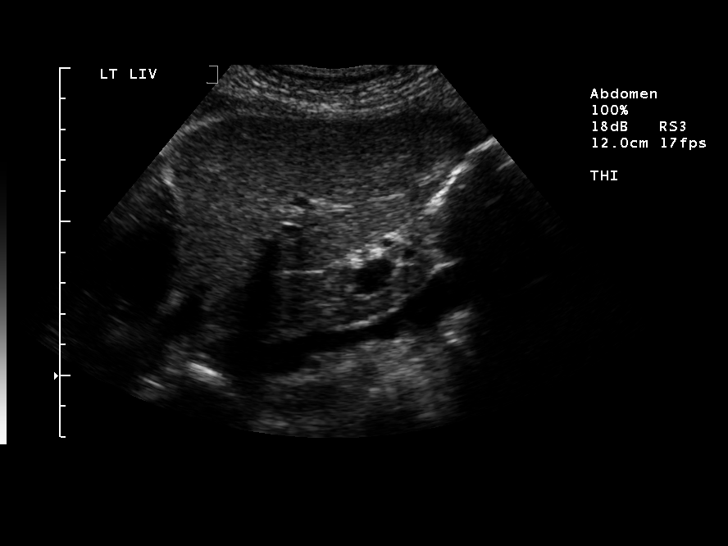
[im 3/18]
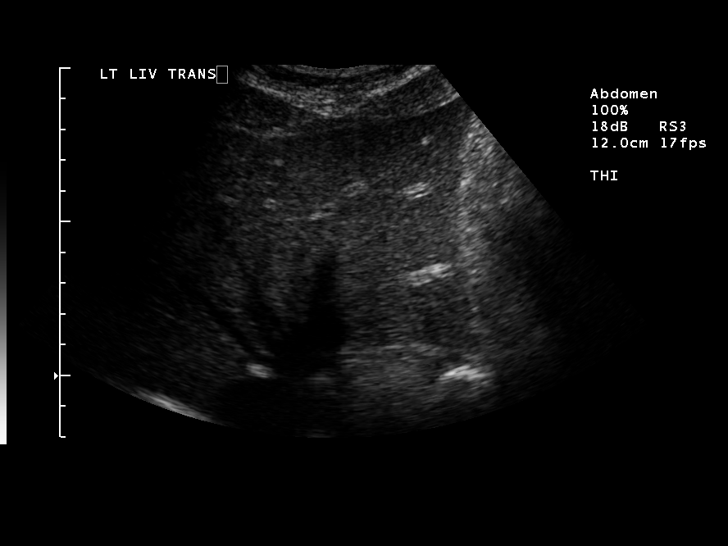
[im 4/18]
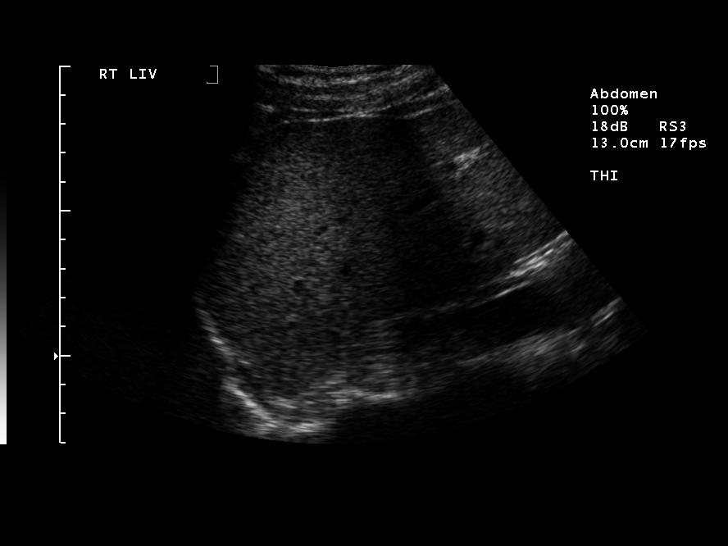
[im 5/18]
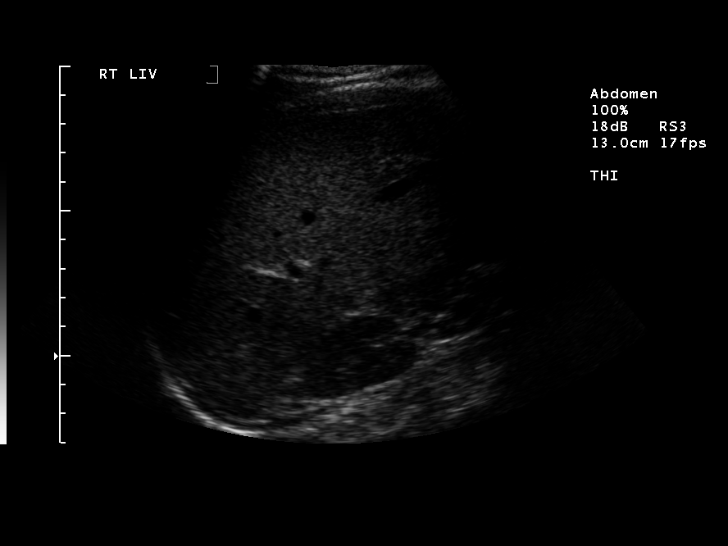
[im 7/18]
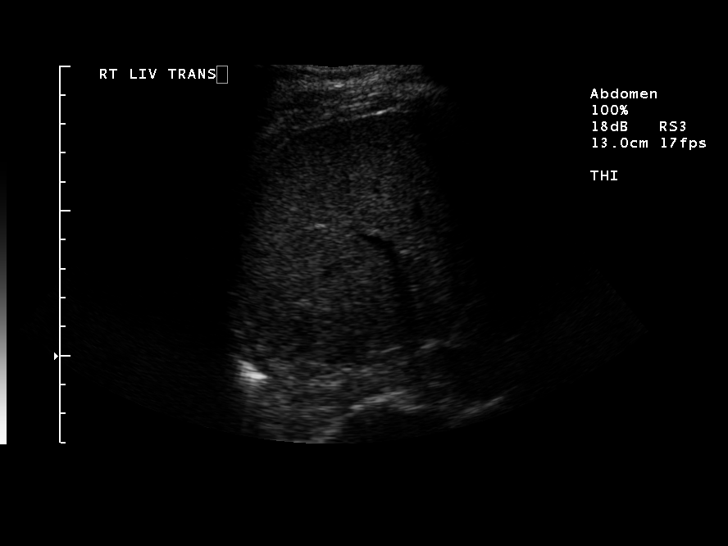
[im 8/18]
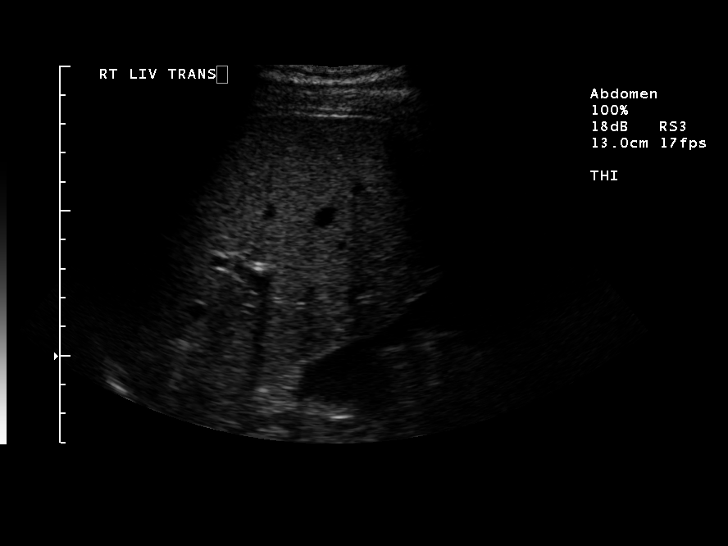
[im 10/18]
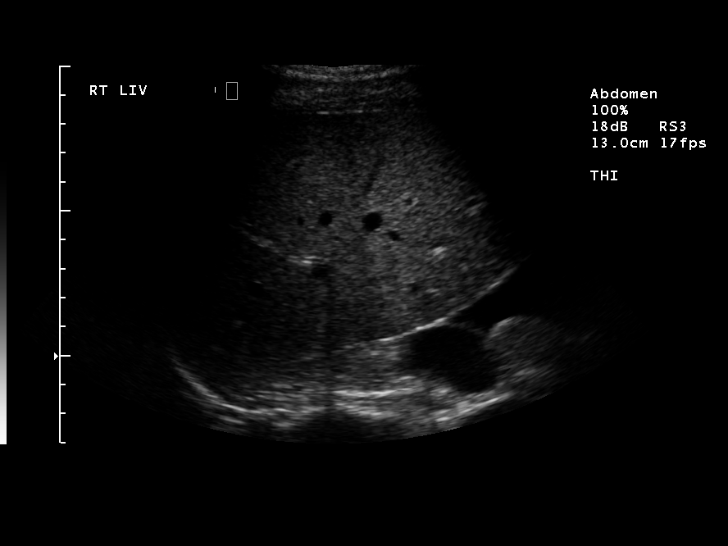
[im 11/18]
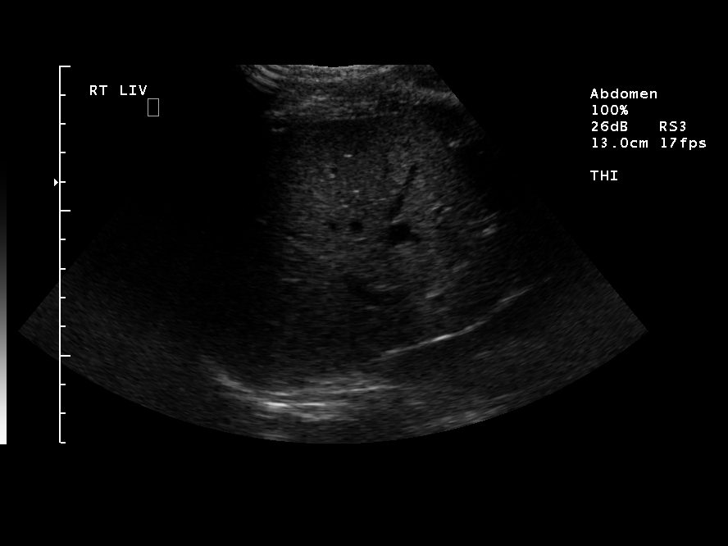
[im 12/18]
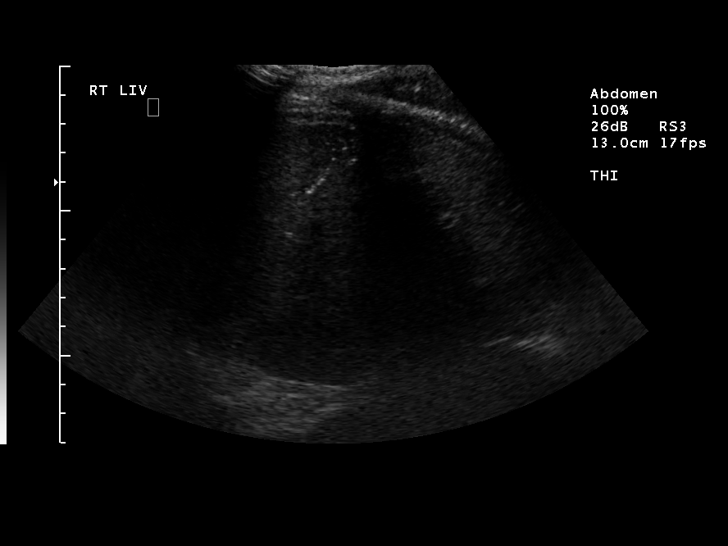
[im 14/18]
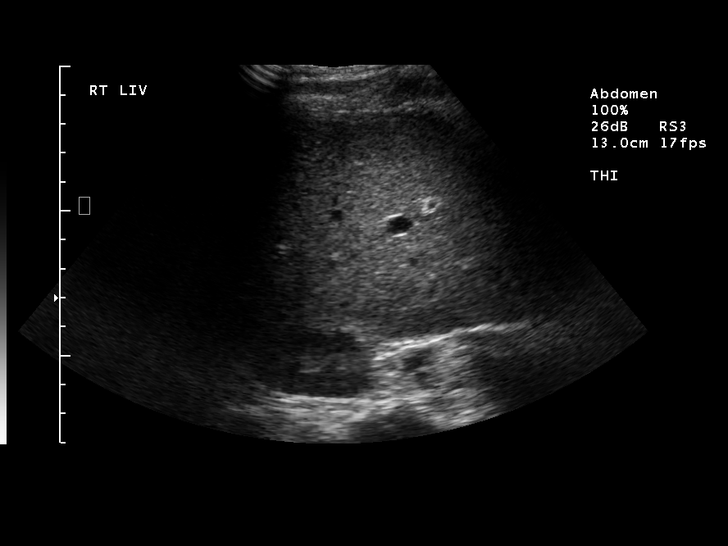
[im 15/18]
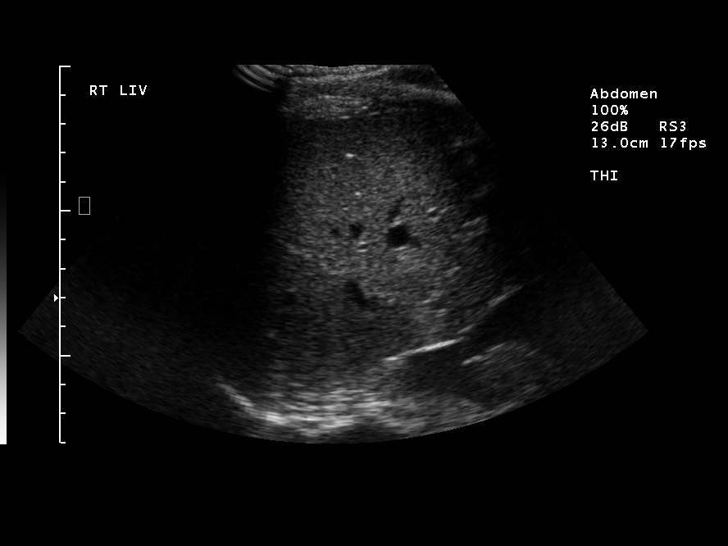
[im 16/18]
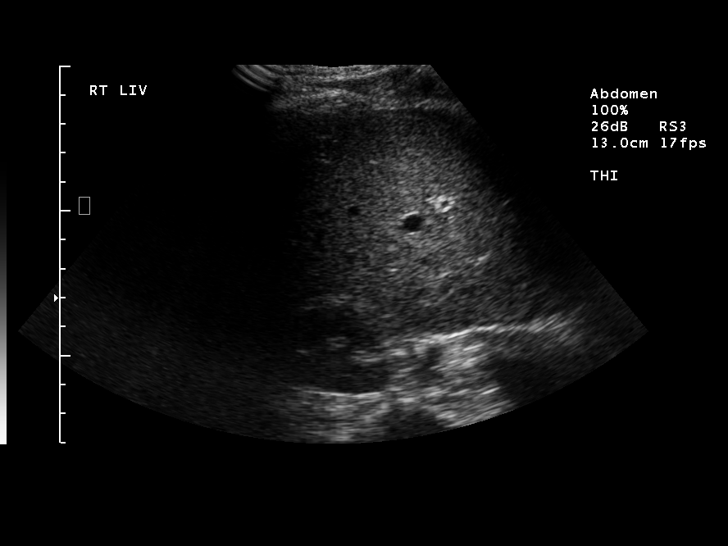
[im 18/18]
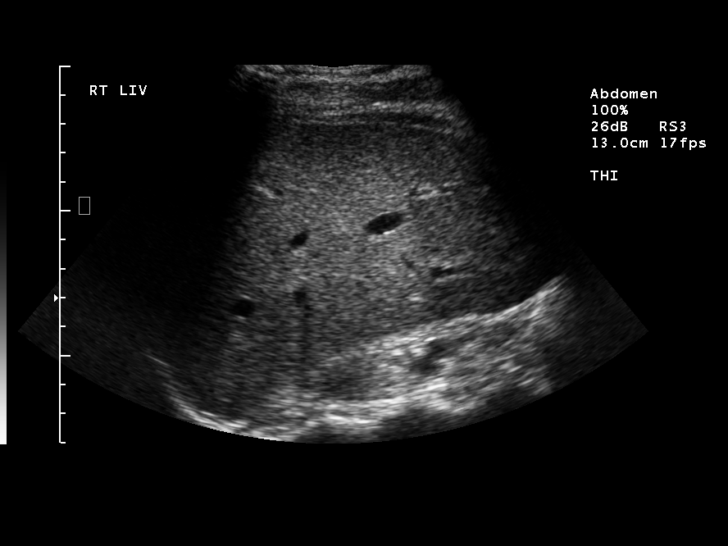

[13 of 18 positions shown; findings below may reference images not displayed]

FINDINGS: An ultrasound-guided random core liver biopsy was thoroughly discussed with the patient and questions were answered.  The benefits, risks, alternatives, and complications were also discussed.  The patient understands and wishes to proceed with the procedure.  Verbal as well as written consent was obtained.
Under ultrasound guidance, an appropriate skin site was marked.  The patient was then prepped and draped in the normal sterile fashion.  1% lidocaine was used for local anesthesia.  Through a 17-gauge guiding trocar, 3 passes were then made into the right hepatic lobe with an 18-gauge biopsy gun.  Ultrasound imaging confirmed appropriate needle placement in the liver parenchyma.  Specimens were sent to pathology for further evaluation.  The patient tolerated the procedure well and there were no immediate complications.  
Medications utilized:  Versed 3 mg IV, fentanyl 100 micrograms IV.  Cardiorespiratory monitoring was performed by the interventional radiology nurse during the procedure.  The patient?s vital signs remained stable throughout the procedure, and she will be observed in the [REDACTED] for an additional 3 hours post-biopsy and then discharged home afterwards if stable.
Total Time of Sedation:  20 minutes.
Procedure was performed under the supervision of Dr. Lov S Cinta Kirah.
IMPRESSION: Successful ultrasound-guided random core liver biopsy of the right hepatic lobe as discussed above.

## 2009-11-17 ENCOUNTER — Ambulatory Visit: Payer: Self-pay | Admitting: Oncology

## 2010-05-07 ENCOUNTER — Ambulatory Visit: Payer: Self-pay | Admitting: Oncology

## 2010-05-11 LAB — COMPREHENSIVE METABOLIC PANEL
ALT: 44 U/L — ABNORMAL HIGH (ref 0–35)
AST: 49 U/L — ABNORMAL HIGH (ref 0–37)
Alkaline Phosphatase: 62 U/L (ref 39–117)
Calcium: 9.4 mg/dL (ref 8.4–10.5)
Chloride: 107 mEq/L (ref 96–112)
Creatinine, Ser: 0.85 mg/dL (ref 0.40–1.20)
Potassium: 3.9 mEq/L (ref 3.5–5.3)

## 2010-05-11 LAB — CBC WITH DIFFERENTIAL/PLATELET
BASO%: 0.4 % (ref 0.0–2.0)
EOS%: 0.4 % (ref 0.0–7.0)
MCH: 31 pg (ref 25.1–34.0)
MCHC: 34.5 g/dL (ref 31.5–36.0)
MCV: 90 fL (ref 79.5–101.0)
MONO%: 9.1 % (ref 0.0–14.0)
NEUT%: 53.7 % (ref 38.4–76.8)
RDW: 13.4 % (ref 11.2–14.5)
lymph#: 1.7 10*3/uL (ref 0.9–3.3)

## 2010-05-18 ENCOUNTER — Encounter: Admission: RE | Admit: 2010-05-18 | Discharge: 2010-05-18 | Payer: Self-pay | Admitting: Oncology

## 2010-08-22 ENCOUNTER — Encounter: Payer: Self-pay | Admitting: Oncology

## 2010-08-31 NOTE — Letter (Signed)
[facility]   Imported By: Marylou Mccoy 12/08/2009 13:32:31  _____________________________________________________________________  External Attachment:    Type:   Image     Comment:   External Document

## 2010-08-31 NOTE — Letter (Signed)
[facility]   Imported By: Marylou Mccoy 12/08/2009 13:33:57  _____________________________________________________________________  External Attachment:    Type:   Image     Comment:   External Document

## 2010-12-17 NOTE — Op Note (Signed)
Dawn Sparks, Dawn Sparks              ACCOUNT NO.:  [id number]   MEDICAL RECORD NO.:  [id number]          PATIENT TYPE:  AMB   LOCATION:  DSC                          FACILITY:  [facility]   PHYSICIAN:  Currie Paris, M.D.DATE OF BIRTH:  1955-10-20   DATE OF PROCEDURE:  DATE OF DISCHARGE:  10/21/2005                                 OPERATIVE REPORT   Office medical record number:  [facility] [id number].   PREOP DIAGNOSIS:  Carcinoma right breast.   POSTOPERATIVE DIAGNOSIS:  Carcinoma right breast.   OPERATION:  Place a Port-A-Cath.   SURGEON:  Dr. Jamey Ripa.   ANESTHESIA:  MAC.   CLINICAL HISTORY:  This is a 55 year old lady who has a right breast cancer  and has elected to begin induction neoadjuvant chemotherapy.  She needs IV  access for chemotherapy.   DESCRIPTION OF PROCEDURE:  The patient was seen in the holding area.  I  reviewed the procedure risks and complications again with her.  She had no  further questions.   The patient was taken to operating room and after satisfactory IV sedation,  was placed in Trendelenburg position.  The upper chest and lower neck area  prepped and draped as a sterile field.  Time-out occurred.   I injected 1% Xylocaine in the left infraclavicular area and I entered the  subclavian vein.  The guidewire was threaded easily and confirmed in the  superior vena cava by fluoro.   Additional local was infiltrated over the anterior chest wall and transverse  incision made.  Some subcutaneous tissue was divided and the skin flap  raised below to place the reservoir.  Additional local was infiltrated from  this point to the guidewire site.  The Port-A-Cath tubing was pulled through  the subcutaneous tunnel.   The guidewire site was dilated once and then the dilator and guidewire  removed and the catheter tubing threaded to approximately 23 cm.  The peel-  away sheath was removed and the catheter was seen to be in the right atrium.  Under flouroscopic guidance  it was backed up untile it was in the mid to  superior vena cava.  Again it aspirated irrigated easily.  The reservoir was  flushed and was then attached and locking mechanism engaged.  Again this  aspirated irrigated easily.  The reservoir secured to the fascia with two  sutures of 3-0 Vicryl.  Final fluoro showed everything in good positioning.   Incision was closed with 3-0 Vicryl, 4-0 Monocryl subcuticular and  Dermabond.  These catheter was flushed with concentrated aqueous heparin.   The patient and procedure well.  There were no operative complications.      Currie Paris, M.D.  Electronically Signed     CJS/MEDQ  D:  10/21/2005  T:  10/24/2005  Job:  [id number]

## 2010-12-17 NOTE — Consult Note (Signed)
Dawn Sparks, Dawn Sparks              ACCOUNT NO.:  [id number]   MEDICAL RECORD NO.:  [id number]          PATIENT TYPE:  OUT   LOCATION:  XRAY                         FACILITY:  [facility]   PHYSICIAN:  De Blanch, M.D.DATE OF BIRTH:  09-16-1955   DATE OF CONSULTATION:  10/14/2005  DATE OF DISCHARGE:  10/14/2005                                   CONSULTATION   CHIEF COMPLAINT:  Pelvic cyst.   HISTORY OF PRESENT ILLNESS:  A 55 year old white married female, gravida 2  seen in consultation at the request of Dr. Marikay Alar Magrinat regarding management  of bilateral multicystic adnexal masses and an elevated CA-125.   In the course of evaluating the patient's newly diagnosed breast cancer, she  underwent a PET CT scan. The PET scan showed activity in the 3 cm right  breast mass compatible with the patient's a breast cancer. In addition, a  2.1 cm cystic area was noted in the right adnexa. Subsequently the patient  has undergone ultrasound by Dr. Jeanine Luz in his office who has identified  simple cysts in both ovaries which are multiloculated but simple without any  excrescences,  nodularity or solid areas. In addition on ultrasound, she has  a 9 mm endometrial stripe. An endometrial biopsy has been performed which  was negative according the patient's history. The patient herself is  entirely asymptomatic with regard to pelvic symptoms.   The patient has continued to have regular menstrual periods being  premenopausal. She has had some irregularity of her menstrual cycle over the  past year or so but had her last menstrual period approximately a week ago.  She is not using any method of contraception but has had a tubal ligation 15  years ago. She has no other significant gynecologic history.   OBSTETRICAL HISTORY:  Gravida 2.   SURGICAL HISTORY:  Tubal ligation, appendectomy.   DRUG ALLERGIES:  None.   SOCIAL HISTORY:  The patient is married to an Tree surgeon. She is a smoker.   FAMILY HISTORY:  Negative for gynecologic, breast or colon cancers.   CURRENT MEDICATIONS:  Calcium with vitamin D, multivitamin, Ultram p.r.n.  pain.   REVIEW OF SYSTEMS:  A 10-point comprehensive review of systems negative  except as noted above.   PHYSICAL EXAM:  GENERAL:  Reveals a well-developed slender white female in  no acute distress.  VITAL SIGNS:  Weight 137 pounds, height 5 feet 6, pulse 80, respiratory rate  20.  HEENT:  Negative.  NECK:  Supple without thyromegaly. There is no supraclavicular or inguinal  adenopathy.  ABDOMEN:  The abdomen is soft, nontender. No mass, organomegaly, ascites or  hernias noted.  PELVIC:  EGBUS, vagina, bladder and urethra were normal. Cervix is parous  and normal. Uterus is anterior normal shape, size and consistency. I do not  feel any adnexal masses. Rectovaginal exam confirms.  LOWER EXTREMITIES:  Without edema or varicosities.   IMPRESSION:  Multicystic ovaries which may either represent functional cysts  and/or paratubal cysts or hydrosalpinx. The characteristics on ultrasound  speak to this being a benign process and given that it is asymptomatic  I do  not believe surgical intervention is warranted at this time.   With regard to her elevated CA-125, we had a lengthy discussion regarding  the differential diagnosis which could include elevation secondary to her  breast cancer or to other benign causes.  Certainly modestly elevated CA-125  does not necessarily indicate ovarian cancer.   After discussing all of this with the patient and her husband, we have  agreed that follow-up is most appropriate. I would suggest she have a repeat  ultrasound of the pelvis in approximately 2-3 months and at the same time  have a repeat CA-125 value obtained.   In the interim, I would recommend she initiate her breast cancer treatment.      De Blanch, M.D.  Electronically Signed     DC/MEDQ  D:  10/14/2005  T:  10/17/2005   Job:  [id number]   cc:   Valentino Hue. Magrinat, M.D.  Fax: [phone]   Almedia Balls. Randell Patient, M.D.  Fax: [phone]   Telford Nab, R.N.  [address]  [place], [place] [address]   Gloriajean Dell. Andrey Campanile, M.D.  Fax: [phone]

## 2010-12-17 NOTE — Op Note (Signed)
NAMEZIYAN, HILLMER              ACCOUNT NO.:  [id number]   MEDICAL RECORD NO.:  [id number]          PATIENT TYPE:  AMB   LOCATION:  DSC                          FACILITY:  [facility]   PHYSICIAN:  Currie Paris, M.D.DATE OF BIRTH:  [birth year]   DATE OF PROCEDURE:  04/11/2006  DATE OF DISCHARGE:                                 OPERATIVE REPORT   PREOPERATIVE DIAGNOSIS:  Cancer, right breast, upper outer quadrant, status  post induction chemotherapy.   POSTOPERATIVE DIAGNOSIS:  Cancer, right breast, upper outer quadrant, status  post induction chemotherapy.   OPERATIONS:  Needle-guided excision of right breast cancer, blue dye  injection, right axillary sentinel lymph node biopsy (four nodes).   SURGEON:  Currie Paris, M.D.   ANESTHESIA:  General.   CLINICAL HISTORY:  Dawn Sparks is a 55 year old lady, who has had a  recently diagnosed right breast cancer, which was fairly large in size.  She  underwent preop neoadjuvant chemotherapy and now comes to the operating room  for definitive surgery.  Her preop workup showed no clinical or radiographic  evidence of residual cancer.  Fortunately, a clip had been placed at the  site of the primary prior to completion of her neoadjuvant therapy.   DESCRIPTION OF PROCEDURE:  The patient was seen in the holding area and had  no further questions.  The right breast was marked as the operative side.  Her mammogram films were reviewed and the guide wire was placed  appropriately.  She was injected with her radioactive isotope.   The patient was then taken to the operating room, and after satisfactory  general anesthesia had been obtained, the time-out occurred.  I then  injected 5 cc of methylene blue (3 cc of injectable saline and 2 cc of  methylene blue mixed together).  This was done subareolarly.  It was  massaged in.   The breast was then completely prepped and draped.  The guide wire entered  in about the 11:30 position  and tracked inferiorly.  I made a curvilinear  incision at about where I thought the tumor would be located, based on the  preoperative mammogram.  I raised very thin skin flaps superiorly,  inferiorly, then a little medial and lateral.  Then, medially, I went down  to the chest wall, then around superiorly above the guide wire, then  inferiorly so that I was below the guide wire just about at the areolar  margin, and then under the breast, taking the fascia with the specimen.  I  then disconnected the tissue laterally.  Upon doing this, there was a  palpable mass that was near the deep margin, but the margin was free over  it, and I thought I was well around it in all directions.  This tissue was  sent for specimen mammography.  I could not be clear whether this mass was  residual tumor or perhaps some breast cyst.   I made sure everything was dry.  I injected some Marcaine around the edges.  As I was inspecting the area, I found some nodular density along the  inferolateral area that I thought might represent just some fibrocystic  change.  But I went ahead and took another small rim of margin there to make  sure we did not have some form of multifocal tumor with some small residual  tumor in this area.   Once everything was dry, I put four metal clips on to mark the margins for  radiation therapy.  I left a moist pack and turned my attention to the  axilla.   Using the Neoprobe, we identified a hot area.  I made a transverse incision  and placed a self-retaining retractor.  I almost immediately found a blue  lymphatic leading to a blue lymph node that was bright blue, and it was  excised with cautery.  Using the Neoprobe, I found 3 other very small blue  nodes, which all had counts and were all removed.  With removal of the final  one, there were no counts above the background of about 5 to 10, with the  hottest node having registered about 2000 to 2500.  There was no palpable   adenopathy noted.   I injected Marcaine here to help with postop pain relief.  I then closed  first the breast in layers with 3-0 Vicryl subcu and 4-0 Monocryl  subcuticular, leaving a cavity for a seroma, and then, likewise, the  axillary incision.   Radiology reported that the clip and the guide wire were well within the  specimen.  Pathology reported that the touch preps on the lymph nodes were  all negative.   The patient tolerated the procedure well and there were no operative  complications.  All counts were correct.      Currie Paris, M.D.  Electronically Signed     CJS/MEDQ  D:  04/11/2006  T:  04/11/2006  Job:  [id number]   cc:   Gloriajean Dell. Andrey Campanile, M.D.  Drue Dun Magrinat, M.D.

## 2010-12-28 ENCOUNTER — Other Ambulatory Visit: Payer: Self-pay | Admitting: Oncology

## 2010-12-28 ENCOUNTER — Encounter (HOSPITAL_BASED_OUTPATIENT_CLINIC_OR_DEPARTMENT_OTHER): Payer: Self-pay | Admitting: Oncology

## 2010-12-28 DIAGNOSIS — C50919 Malignant neoplasm of unspecified site of unspecified female breast: Secondary | ICD-10-CM

## 2010-12-28 LAB — CBC WITH DIFFERENTIAL/PLATELET
Basophils Absolute: 0 10*3/uL (ref 0.0–0.1)
EOS%: 1 % (ref 0.0–7.0)
Eosinophils Absolute: 0 10*3/uL (ref 0.0–0.5)
HCT: 39 % (ref 34.8–46.6)
HGB: 13.4 g/dL (ref 11.6–15.9)
LYMPH%: 52.9 % — ABNORMAL HIGH (ref 14.0–49.7)
MCH: 30.8 pg (ref 25.1–34.0)
MCV: 89.3 fL (ref 79.5–101.0)
MONO%: 9.9 % (ref 0.0–14.0)
NEUT#: 1.5 10*3/uL (ref 1.5–6.5)
NEUT%: 35.8 % — ABNORMAL LOW (ref 38.4–76.8)
Platelets: 195 10*3/uL (ref 145–400)
RDW: 13.3 % (ref 11.2–14.5)

## 2010-12-28 LAB — COMPREHENSIVE METABOLIC PANEL
AST: 35 U/L (ref 0–37)
Albumin: 3.9 g/dL (ref 3.5–5.2)
Alkaline Phosphatase: 58 U/L (ref 39–117)
BUN: 5 mg/dL — ABNORMAL LOW (ref 6–23)
Creatinine, Ser: 0.74 mg/dL (ref 0.40–1.20)
Glucose, Bld: 98 mg/dL (ref 70–99)
Total Bilirubin: 0.3 mg/dL (ref 0.3–1.2)

## 2010-12-31 ENCOUNTER — Other Ambulatory Visit: Payer: Self-pay | Admitting: Oncology

## 2010-12-31 ENCOUNTER — Encounter (HOSPITAL_BASED_OUTPATIENT_CLINIC_OR_DEPARTMENT_OTHER): Payer: Self-pay | Admitting: Oncology

## 2010-12-31 DIAGNOSIS — C50419 Malignant neoplasm of upper-outer quadrant of unspecified female breast: Secondary | ICD-10-CM

## 2010-12-31 DIAGNOSIS — Z853 Personal history of malignant neoplasm of breast: Secondary | ICD-10-CM

## 2011-05-10 LAB — CBC
MCHC: 34.5
MCV: 87.7
RBC: 4.13
RDW: 13

## 2011-05-10 LAB — PROTIME-INR: Prothrombin Time: 12.6

## 2011-05-20 ENCOUNTER — Inpatient Hospital Stay: Admission: RE | Admit: 2011-05-20 | Payer: Self-pay | Source: Ambulatory Visit

## 2011-07-07 ENCOUNTER — Other Ambulatory Visit: Payer: Self-pay | Admitting: Lab

## 2011-07-14 ENCOUNTER — Ambulatory Visit: Payer: Self-pay | Admitting: Oncology

## 2011-07-14 DIAGNOSIS — C50919 Malignant neoplasm of unspecified site of unspecified female breast: Secondary | ICD-10-CM | POA: Insufficient documentation

## 2013-02-27 ENCOUNTER — Other Ambulatory Visit: Payer: Self-pay | Admitting: Family Medicine

## 2013-02-27 DIAGNOSIS — Z853 Personal history of malignant neoplasm of breast: Secondary | ICD-10-CM

## 2013-07-23 ENCOUNTER — Other Ambulatory Visit: Payer: Self-pay | Admitting: Internal Medicine

## 2013-07-23 DIAGNOSIS — C220 Liver cell carcinoma: Secondary | ICD-10-CM

## 2013-07-31 ENCOUNTER — Ambulatory Visit
Admission: RE | Admit: 2013-07-31 | Discharge: 2013-07-31 | Disposition: A | Discharge status: Home / Self Care | Payer: BC Managed Care – PPO | Source: Ambulatory Visit | Attending: Internal Medicine | Admitting: Internal Medicine

## 2013-07-31 DIAGNOSIS — C220 Liver cell carcinoma: Secondary | ICD-10-CM

## 2022-02-14 ENCOUNTER — Ambulatory Visit (INDEPENDENT_AMBULATORY_CARE_PROVIDER_SITE_OTHER): Payer: Medicare HMO | Admitting: Licensed Clinical Social Worker

## 2022-02-14 DIAGNOSIS — F4329 Adjustment disorder with other symptoms: Secondary | ICD-10-CM

## 2022-02-15 NOTE — BH Specialist Note (Signed)
Integrated Behavioral Health Initial In-Person Visit  MRN: [id number] Name: Dawn Sparks  Number of [place] Clinician visits: 1- Initial Visit  Session Start time: 0109    Session End time: 3235  Total time in minutes: 55   Types of Service: Individual psychotherapy  Interpretor:No. Interpretor Name and Language: NA    Warm Hand Off Completed.        Subjective: Dawn Sparks is a 66 y.o. female accompanied by  Self Patient was referred by Sharene Butters  for Caregiver Distress . 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: Pt does have medical issues herself and facing emotional and physical strain of care giving  duties  Life Changes: Pt's spouse has significant changed cognitively which has impacted her role and and caused more stress and burden   Patient and/or Family's Strengths/Protective Factors: Concrete supports in place (healthy food, safe environments, etc.)  Goals Addressed: Patient will: Reduce symptoms of: agitation, anxiety, and stress Increase knowledge and/or ability of: coping skills and stress reduction  Demonstrate ability to: Increase healthy adjustment to current life circumstances and Increase adequate support systems for patient/family  Progress towards Goals: Ongoing  Interventions: Interventions utilized: Solution-Focused Strategies, Supportive Counseling, and Link to [employer]  Assessments completed: Not Needed  Patient and/or Family Response: Pt open to supportive counseling and resources provided .   Patient Centered Plan: Patient is on the following Treatment Plan(s):  Gain knowledge about spouse's condition and  mental health issues if both related or separate .  Maximize the use of formal and informal support systems , and look at self care and acknowledge caring for the caregiver .  Assessment: Patient currently experiencing Feelings of being overwhelmed with duties entailed with caring for her spouse and feelings of concern for the future with strain and burden.   Patient may benefit from Gain knowledge about spouse's condition and  mental health issues if both related or separate .  Maximize the use of formal and informal support systems , and look at self care and acknowledge caring for the caregiver .Marland Kitchen  Plan: Follow up with behavioral health clinician on : As Needed  Behavioral recommendations: Follow up with [employer] of [place], [place], [employer], Care giver Support Group, [facility] Website, Caregiver resources provided [employer] Provided   Referral(s): Community Resources:  [place], [place], [employer], Care giver Support Group, [facility] Website, Caregiver resources provided [employer] Provided   "From scale of 1-10, how likely are you to follow plan?": 10  Marykate Heuberger A Taylor-Paladino, LCSW

## 2024-01-22 ENCOUNTER — Ambulatory Visit: Admitting: Podiatry

## 2024-01-26 ENCOUNTER — Ambulatory Visit: Admitting: Podiatry

## 2024-01-26 ENCOUNTER — Ambulatory Visit (INDEPENDENT_AMBULATORY_CARE_PROVIDER_SITE_OTHER)

## 2024-01-26 DIAGNOSIS — M79671 Pain in right foot: Secondary | ICD-10-CM

## 2024-01-26 DIAGNOSIS — G5763 Lesion of plantar nerve, bilateral lower limbs: Secondary | ICD-10-CM

## 2024-01-26 DIAGNOSIS — G5761 Lesion of plantar nerve, right lower limb: Secondary | ICD-10-CM | POA: Diagnosis not present

## 2024-01-26 DIAGNOSIS — G5762 Lesion of plantar nerve, left lower limb: Secondary | ICD-10-CM | POA: Diagnosis not present

## 2024-01-26 DIAGNOSIS — M2011 Hallux valgus (acquired), right foot: Secondary | ICD-10-CM

## 2024-01-26 DIAGNOSIS — M79672 Pain in left foot: Secondary | ICD-10-CM

## 2024-01-26 DIAGNOSIS — M2012 Hallux valgus (acquired), left foot: Secondary | ICD-10-CM | POA: Diagnosis not present

## 2024-01-26 MED ORDER — TRIAMCINOLONE ACETONIDE 10 MG/ML IJ SUSP
10.0000 mg | Freq: Once | INTRAMUSCULAR | Status: AC
  Administered 2024-01-26: 10 mg

## 2024-01-26 NOTE — Progress Notes (Signed)
 Patient complaints with complaint of pain on the plantar aspect of the foot and a number full sensation under the 2nd and 3rd metatarsal phalangeal joints and into the digits.  Occasionally gets some shooting sensations.  Does not recall any injury to the area.  Has not noticed any redness swelling or ecchymosis.  Does not recall any injury to the area   Physical exam:  General appearance: Pleasant, and in no acute distress. AOx3.  Vascular: Pedal pulses: DP 2/4 bilaterally, PT 2/4 bilaterally.  No edema lower legs bilaterally. Capillary fill time immediate bilaterally.  Neurological: Light touch intact feet bilaterally.  Normal Achilles reflex bilaterally.  No clonus or spasticity noted. Positive Mulder sign second IMS bilaterally skin normal temperature bilaterally.   Dermatologic:    Skin normal color, tone, and texture bilaterally.   Musculoskeletal: Hallux abductovalgus bilaterally moderate.  Mild hammertoe deformities 2 through 5 bilaterally tenderness in the eighth third second intermetatarsal space bilaterally no tenderness with range of motion of the lesser MTPs  Radiographs: 3 views feet bilaterally: Hallux abductovalgus.  Mild hammertoe deformities 2 through 5 bilaterally no assenting fractures or dislocations.  Normal bone density.  No evidence of any bone tumors  Diagnosis: 1.  Pain feet bilaterally 2.  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  Hallux abductovalgus bilaterally  Plan: -New patient visit office for evaluation and management.  Modifier 25. - Discussed with her neuromas in their etiology and treatment.  Explained that they can usually be treated conservatively without the need for surgery.  Will try injections today.  Discussed proper shoes to wear. -injected 3cc 2:1 mixture 0.5 cc Marcaine:Kenolog 10mg /94ml at neuroma second plantar common digital nerve left.  -injected 3cc 2:1 mixture 0.5 cc Marcaine:Kenolog 10mg /32ml at neuroma second  plantar common digital nerve right.     Return 2 weeks follow-up injection neuroma bilaterally

## 2024-02-09 ENCOUNTER — Ambulatory Visit (INDEPENDENT_AMBULATORY_CARE_PROVIDER_SITE_OTHER): Admitting: Podiatry

## 2024-02-09 ENCOUNTER — Encounter: Payer: Self-pay | Admitting: Podiatry

## 2024-02-09 DIAGNOSIS — G5762 Lesion of plantar nerve, left lower limb: Secondary | ICD-10-CM | POA: Diagnosis not present

## 2024-02-09 DIAGNOSIS — G5761 Lesion of plantar nerve, right lower limb: Secondary | ICD-10-CM

## 2024-02-09 DIAGNOSIS — G5763 Lesion of plantar nerve, bilateral lower limbs: Secondary | ICD-10-CM

## 2024-02-09 MED ORDER — TRIAMCINOLONE ACETONIDE 10 MG/ML IJ SUSP
10.0000 mg | Freq: Once | INTRAMUSCULAR | Status: AC
  Administered 2024-02-09: 10 mg

## 2024-02-09 NOTE — Progress Notes (Signed)
 Patient presents follow-up injections for neuroma second intermetatarsal space bilaterally.  Doing much better still little bit of soreness still has numbness also.   Physical exam:  General appearance: Pleasant, and in no acute distress. AOx3.  Vascular: Pedal pulses: DP 2/4 bilaterally, PT 2/4 bilaterally.Capillary fill time immediate.  Neurological: Positive Mulder sign's second IMS bilaterally  Dermatologic:   Skin normal temperature bilaterally.  Skin normal color, tone, and texture bilaterally.   Musculoskeletal: Tenderness second intermetatarsal space bilaterally.  No tenderness palpation of the plantar aspect of the lesser MTPs.  No tenderness range of motion lesser MTPs.   Diagnosis: 1.  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  Discussed with her that sometimes the numbing some numb sensation can take a little bit longer to go away after several months.  If she has any further pain with it she can call us  for an appointment  -injected 3cc 2:1 mixture 0.5 cc Marcaine:Kenolog 10mg /63ml at neuroma second plantar common digital nerve left.  -injected 3cc 2:1 mixture 0.5 cc Marcaine:Kenolog 10mg /31ml at neuroma second plantar common digital nerve right  Return prn
